# Patient Record
Sex: Male | Born: 1980
Health system: Southern US, Community
[De-identification: ages and names within clinical notes are randomized; demographics above are authoritative.]

## PROBLEM LIST (undated history)

## (undated) DIAGNOSIS — R001 Bradycardia, unspecified: Secondary | ICD-10-CM

## (undated) DIAGNOSIS — E785 Hyperlipidemia, unspecified: Secondary | ICD-10-CM

## (undated) DIAGNOSIS — I255 Ischemic cardiomyopathy: Secondary | ICD-10-CM

## (undated) DIAGNOSIS — I119 Hypertensive heart disease without heart failure: Secondary | ICD-10-CM

## (undated) DIAGNOSIS — I2510 Atherosclerotic heart disease of native coronary artery without angina pectoris: Secondary | ICD-10-CM

## (undated) DIAGNOSIS — M7050 Other bursitis of knee, unspecified knee: Secondary | ICD-10-CM

## (undated) DIAGNOSIS — E669 Obesity, unspecified: Secondary | ICD-10-CM

## (undated) DIAGNOSIS — E1169 Type 2 diabetes mellitus with other specified complication: Secondary | ICD-10-CM

## (undated) DIAGNOSIS — Z720 Tobacco use: Secondary | ICD-10-CM

## (undated) DIAGNOSIS — F17201 Nicotine dependence, unspecified, in remission: Secondary | ICD-10-CM

## (undated) DIAGNOSIS — I5041 Acute combined systolic (congestive) and diastolic (congestive) heart failure: Secondary | ICD-10-CM

## (undated) HISTORY — DX: Nicotine dependence, unspecified, in remission: F17.201

## (undated) HISTORY — PX: OTHER SURGICAL HISTORY: SHX169

---

## 2000-08-21 ENCOUNTER — Emergency Department (HOSPITAL_COMMUNITY): Admission: EM | Admit: 2000-08-21 | Discharge: 2000-08-21 | Payer: Self-pay | Admitting: Emergency Medicine

## 2009-07-07 ENCOUNTER — Emergency Department (HOSPITAL_BASED_OUTPATIENT_CLINIC_OR_DEPARTMENT_OTHER): Admission: EM | Admit: 2009-07-07 | Discharge: 2009-07-08 | Payer: Self-pay | Admitting: Emergency Medicine

## 2010-07-21 LAB — BASIC METABOLIC PANEL
BUN: 10 mg/dL (ref 6–23)
CO2: 29 mEq/L (ref 19–32)
Calcium: 9.3 mg/dL (ref 8.4–10.5)
Chloride: 105 mEq/L (ref 96–112)
Creatinine, Ser: 1 mg/dL (ref 0.4–1.5)
GFR calc Af Amer: >60 mL/min (ref >60)
GFR calc non Af Amer: >60 mL/min (ref >60)
Glucose, Bld: 89 mg/dL (ref 70–99)
Potassium: 4.4 mEq/L (ref 3.5–5.1)
Sodium: 145 mEq/L (ref 135–145)

## 2011-03-15 ENCOUNTER — Emergency Department (INDEPENDENT_AMBULATORY_CARE_PROVIDER_SITE_OTHER): Payer: Self-pay

## 2011-03-15 ENCOUNTER — Encounter: Payer: Self-pay | Admitting: Emergency Medicine

## 2011-03-15 ENCOUNTER — Emergency Department (HOSPITAL_BASED_OUTPATIENT_CLINIC_OR_DEPARTMENT_OTHER)
Admission: EM | Admit: 2011-03-15 | Discharge: 2011-03-15 | Disposition: A | Discharge status: Home / Self Care | Payer: Self-pay | Attending: Emergency Medicine | Admitting: Emergency Medicine

## 2011-03-15 DIAGNOSIS — M25539 Pain in unspecified wrist: Secondary | ICD-10-CM

## 2011-03-15 DIAGNOSIS — F17200 Nicotine dependence, unspecified, uncomplicated: Secondary | ICD-10-CM | POA: Insufficient documentation

## 2011-03-15 MED ORDER — PREDNISONE 50 MG PO TABS
60.0000 mg | ORAL_TABLET | Freq: Once | ORAL | Status: AC
  Administered 2011-03-15: 60 mg via ORAL
  Filled 2011-03-15: qty 1

## 2011-03-15 MED ORDER — PREDNISONE 10 MG PO TABS: 20.0000 mg | ORAL_TABLET | Freq: Every day | ORAL | Status: AC

## 2011-03-15 MED ORDER — OXYCODONE-ACETAMINOPHEN 5-325 MG PO TABS
1.0000 | ORAL_TABLET | Freq: Once | ORAL | Status: AC
  Administered 2011-03-15: 1 via ORAL

## 2011-03-15 MED ORDER — OXYCODONE-ACETAMINOPHEN 5-325 MG PO TABS
ORAL_TABLET | ORAL | Status: AC
  Administered 2011-03-15: 1 via ORAL
  Filled 2011-03-15: qty 1

## 2011-03-15 NOTE — ED Notes (Signed)
Pain and decreased mobility to R wrist

## 2011-03-15 NOTE — ED Provider Notes (Signed)
History     CSN: [id number] Arrival date & time: 03/15/2011  7:55 AM   First MD Initiated Contact with Patient 03/15/11 0805      Chief Complaint  Patient presents with  . Wrist Pain    R wrist pain decreased mobility denies any trauma    (Consider location/radiation/quality/duration/timing/severity/associated sxs/prior treatment) HPI Patient with right wrist pain since last Monday.  Pain noted on awakening that day, no known injury.  Pain worsens with movement.Pain is more medial anterior and sharp, aching, and throbbing.  7/10 at worst and now.  Patient took aleve without relief.  Patient works at [employer].   History reviewed. No pertinent past medical history.  Past Surgical History  Procedure Date  . Arm sugery     History reviewed. No pertinent family history.  History  Substance Use Topics  . Smoking status: Current Some Day Smoker  . Smokeless tobacco: Not on file  . Alcohol Use: No      Review of Systems  All other systems reviewed and are negative.    Allergies  Naprosyn  Home Medications  No current outpatient prescriptions on file.  BP 130/90  Pulse 99  Temp 97.6 F (36.4 C)  Resp 20  SpO2 99%  Physical Exam  Nursing note and vitals reviewed. Constitutional: He is oriented to person, place, and time. He appears well-developed and well-nourished.  HENT:  Head: Normocephalic.  Eyes: Conjunctivae are normal. Pupils are equal, round, and reactive to light.  Neck: Neck supple.  Cardiovascular: Normal rate.   Pulmonary/Chest: Effort normal.  Neurological: He is alert and oriented to person, place, and time. Cranial nerve deficit: right wrist with tenderness with ulnar and radial flexion, mild diffuse tenderness, no swelling or warmth noted.  Skin: Skin is warm and dry.  Psychiatric: He has a normal mood and affect. His behavior is normal. Judgment and thought content normal.    ED Course  Procedures (including critical care time)  Labs  Reviewed - No data to display No results found.   No diagnosis found.    MDM  Dg Wrist Complete Right  03/15/2011  *RADIOLOGY REPORT*  Clinical Data: Chronic right wrist pain  RIGHT WRIST - COMPLETE 3+ VIEW  Comparison: None.  Findings: No fracture or dislocation is seen.  The joint spaces are preserved.  The visualized soft tissues are unremarkable.  IMPRESSION: Normal wrist radiographs.  Original Report Authenticated By: Charline Bills, M.D.         Hilario Quarry, MD 03/15/11 [phone]

## 2012-07-22 ENCOUNTER — Emergency Department (HOSPITAL_BASED_OUTPATIENT_CLINIC_OR_DEPARTMENT_OTHER)
Admission: EM | Admit: 2012-07-22 | Discharge: 2012-07-22 | Disposition: A | Discharge status: Home / Self Care | Payer: Self-pay | Attending: Emergency Medicine | Admitting: Emergency Medicine

## 2012-07-22 ENCOUNTER — Encounter (HOSPITAL_BASED_OUTPATIENT_CLINIC_OR_DEPARTMENT_OTHER): Payer: Self-pay | Admitting: *Deleted

## 2012-07-22 ENCOUNTER — Emergency Department (HOSPITAL_BASED_OUTPATIENT_CLINIC_OR_DEPARTMENT_OTHER): Payer: Self-pay

## 2012-07-22 ENCOUNTER — Ambulatory Visit: Payer: Self-pay | Admitting: Family Medicine

## 2012-07-22 DIAGNOSIS — Z8739 Personal history of other diseases of the musculoskeletal system and connective tissue: Secondary | ICD-10-CM | POA: Insufficient documentation

## 2012-07-22 DIAGNOSIS — M25469 Effusion, unspecified knee: Secondary | ICD-10-CM | POA: Insufficient documentation

## 2012-07-22 DIAGNOSIS — M25461 Effusion, right knee: Secondary | ICD-10-CM

## 2012-07-22 DIAGNOSIS — F17200 Nicotine dependence, unspecified, uncomplicated: Secondary | ICD-10-CM | POA: Insufficient documentation

## 2012-07-22 HISTORY — DX: Other bursitis of knee, unspecified knee: M70.50

## 2012-07-22 MED ORDER — OXYCODONE-ACETAMINOPHEN 5-325 MG PO TABS
2.0000 | ORAL_TABLET | Freq: Once | ORAL | Status: DC
  Filled 2012-07-22 (×2): qty 2

## 2012-07-22 NOTE — ED Provider Notes (Signed)
History     CSN: [id number]  Arrival date & time 07/22/12  6433   First MD Initiated Contact with Patient 07/22/12 [phone]      Chief Complaint  Patient presents with  . Knee Pain    (Consider location/radiation/quality/duration/timing/severity/associated sxs/prior treatment) HPI  Patient with right knee pain and swelling for one week.  No known injury.  Patient states painful and increases with standing, walking, bending knee, Pain began velow knee and then feels like pulling when bends and than "hurts with walking. Soaking, taking aleve, wrapping, ice, elevationg and some improvement.  However, job is pressuring him to have assessed.  States when he was playing football in high school he hurt knee.  De ies redness or warmth.    Past Medical History  Diagnosis Date  . Bursitis of knee     Past Surgical History  Procedure Laterality Date  . Arm sugery      History reviewed. No pertinent family history.  History  Substance Use Topics  . Smoking status: Current Some Day Smoker  . Smokeless tobacco: Not on file  . Alcohol Use: No      Review of Systems  All other systems reviewed and are negative.    Allergies  Naprosyn  Home Medications  No current outpatient prescriptions on file.  There were no vitals taken for this visit.  Physical Exam  Nursing note and vitals reviewed. Constitutional: He is oriented to person, place, and time. He appears well-developed and well-nourished.  HENT:  Head: Normocephalic and atraumatic.  Eyes: Conjunctivae and EOM are normal. Pupils are equal, round, and reactive to light.  Neck: Normal range of motion. Neck supple.  Cardiovascular: Normal rate and regular rhythm.   Pulmonary/Chest: Effort normal and breath sounds normal.  Abdominal: Soft.  Musculoskeletal:  Right knee with tenderness, no redness, mild warmth.  Full arom, no ligamentous laxity.  Some effusion.   Neurological: He is alert and oriented to person, place, and  time.  Skin: Skin is warm and dry.  Psychiatric: He has a normal mood and affect. His behavior is normal. Judgment and thought content normal.    ED Course  Procedures (including critical care time)  Labs Reviewed - No data to display No results found.   No diagnosis found.    MDM  Right knee with pain and effusion.  No signs of infection. Plan Ace wrap, crutches, and referral to Dr. Pearletha Forge.      Hilario Quarry, MD 07/22/12 [phone]

## 2012-07-22 NOTE — ED Notes (Signed)
Pt amb to room 3 with slow, steady gait favoring rle. Pt reports he has had the same in the past, seen here and dx with bursitis, states he has been using ice and heat with no relief.

## 2012-12-20 ENCOUNTER — Other Ambulatory Visit: Payer: Self-pay | Admitting: Occupational Medicine

## 2012-12-20 ENCOUNTER — Ambulatory Visit: Payer: Self-pay

## 2012-12-20 DIAGNOSIS — Z021 Encounter for pre-employment examination: Secondary | ICD-10-CM

## 2013-07-21 ENCOUNTER — Ambulatory Visit: Payer: Self-pay

## 2014-03-23 ENCOUNTER — Encounter (HOSPITAL_BASED_OUTPATIENT_CLINIC_OR_DEPARTMENT_OTHER): Payer: Self-pay | Admitting: Emergency Medicine

## 2014-03-23 ENCOUNTER — Emergency Department (HOSPITAL_BASED_OUTPATIENT_CLINIC_OR_DEPARTMENT_OTHER): Payer: BC Managed Care – PPO

## 2014-03-23 ENCOUNTER — Emergency Department (HOSPITAL_BASED_OUTPATIENT_CLINIC_OR_DEPARTMENT_OTHER)
Admission: EM | Admit: 2014-03-23 | Discharge: 2014-03-24 | Disposition: A | Discharge status: Home / Self Care | Payer: BC Managed Care – PPO | Attending: Emergency Medicine | Admitting: Emergency Medicine

## 2014-03-23 DIAGNOSIS — M25562 Pain in left knee: Secondary | ICD-10-CM | POA: Insufficient documentation

## 2014-03-23 DIAGNOSIS — R2241 Localized swelling, mass and lump, right lower limb: Secondary | ICD-10-CM | POA: Diagnosis not present

## 2014-03-23 DIAGNOSIS — M25569 Pain in unspecified knee: Secondary | ICD-10-CM

## 2014-03-23 DIAGNOSIS — Z720 Tobacco use: Secondary | ICD-10-CM | POA: Insufficient documentation

## 2014-03-23 DIAGNOSIS — R609 Edema, unspecified: Secondary | ICD-10-CM

## 2014-03-23 DIAGNOSIS — M25561 Pain in right knee: Secondary | ICD-10-CM | POA: Insufficient documentation

## 2014-03-23 NOTE — ED Notes (Signed)
Patient states that he has a knot on the back side of his knee, patient notes swelling to his left knee - the patient denies any SOB or Chest pain

## 2014-03-23 NOTE — ED Provider Notes (Addendum)
CSN: [id number]     Arrival date & time 03/23/14  1858 History  This chart was scribed for Kirk Mckee Jawanna Dykman, MD by Julian HyMorgan Graham, ED Scribe. The patient was seen in [facility]. The patient'Mckee care was started at 10:33 PM.     Chief Complaint  Patient presents with  . Knee Pain    Patient is a 33 y.o. male presenting with knee pain. The history is provided by the patient. No language interpreter was used.  Knee Pain Location:  Knee Time since incident:  1 day Injury: no   Knee location:  R knee and L knee Pain details:    Radiates to:  Does not radiate   Severity:  Moderate   Onset quality:  Sudden   Duration:  1 day   Timing:  Constant   Progression:  Unchanged Chronicity:  New Dislocation: no   Foreign body present:  No foreign bodies Relieved by:  None tried Worsened by:  Nothing tried Ineffective treatments:  None tried Associated symptoms: swelling   Associated symptoms: no fever and no numbness     HPI Comments: Kirtland BouchardJerome Wurtz is a 33 y.o. male who presents to the Emergency Department complaining of left knee swelling onset one day ago. Pt notes it feels like a knot. He notes he has some intermittent pain associated with his left knee and swelling on the left knee. He also complains of right knee pain. 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 He denies recent long car or airplane trips. Pt denies SOB, warmth in the knee, redness in the knee and chest pain.   No PCP.  Past Medical History  Diagnosis Date  . Bursitis of knee    Past Surgical History  Procedure Laterality Date  . Arm sugery     History reviewed. No pertinent family history. History  Substance Use Topics  . Smoking status: Current Some Day Smoker  . Smokeless tobacco: Not on file  . Alcohol Use: No    Review of Systems  Constitutional: Negative for fever and chills.  Respiratory: Negative for shortness of breath.   Cardiovascular: Negative for chest pain.  Gastrointestinal: Negative for  nausea and vomiting.  Musculoskeletal: Positive for joint swelling and arthralgias.  Skin: Negative for color change.  All other systems reviewed and are negative.  Allergies  Naprosyn  Home Medications   Prior to Admission medications   Not on File   Triage Vitals: BP 137/82 mmHg  Pulse 98  Temp(Src) 99 F (37.2 C) (Oral)  Resp 18  SpO2 97%  Physical Exam  Constitutional: He is oriented to person, place, and time. He appears well-developed and well-nourished.  HENT:  Head: Normocephalic and atraumatic.  Right Ear: External ear normal.  Left Ear: External ear normal.  Nose: Nose normal.  Mouth/Throat: Oropharynx is clear and moist.  Eyes: Conjunctivae and EOM are normal. Pupils are equal, round, and reactive to light.  Neck: Normal range of motion. Neck supple.  Cardiovascular: Normal rate, regular rhythm, normal heart sounds and intact distal pulses.   Pulmonary/Chest: Effort normal and breath sounds normal.  Abdominal: Soft. Bowel sounds are normal.  Musculoskeletal: Normal range of motion.  Neurological: He is alert and oriented to person, place, and time. He has normal reflexes.  Skin: Skin is warm and dry.  Psychiatric: He has a normal mood and affect. His behavior is normal. Judgment and thought content normal.  Nursing note and vitals reviewed.   ED Course  Procedures (  including critical care time) DIAGNOSTIC STUDIES: Oxygen Saturation is 97% on RA, adequate  by my interpretation.    COORDINATION OF CARE: 10:38 PM- Patient informed of current plan for treatment and evaluation and agrees with plan at this time.  Imaging Review Dg Knee 1-2 Views Left  03/23/2014   CLINICAL DATA:  Left knee pain and swelling without injury.  EXAM: LEFT KNEE - 1-2 VIEW  COMPARISON:  None.  FINDINGS: There is no evidence of fracture, dislocation, or joint effusion. There is no evidence of arthropathy or other focal bone abnormality. Soft tissues are unremarkable.  IMPRESSION:  Normal left knee.   Electronically Signed   By: Roque LiasJames  Green M.D.   On: 03/23/2014 20:10  Dg Knee 1-2 Views Left  03/23/2014   CLINICAL DATA:  Left knee pain and swelling without injury.  EXAM: LEFT KNEE - 1-2 VIEW  COMPARISON:  None.  FINDINGS: There is no evidence of fracture, dislocation, or joint effusion. There is no evidence of arthropathy or other focal bone abnormality. Soft tissues are unremarkable.  IMPRESSION: Normal left knee.   Electronically Signed   By: Roque LiasJames  Green M.D.   On: 03/23/2014 20:10   [place] Extrem Low Left Ltd  03/24/2014   CLINICAL DATA:  LEFT medial knee lump for 1 week, pain with ambulation.  EXAM: ULTRASOUND LEFT LOWER EXTREMITY LIMITED  TECHNIQUE: Multiplanar high-resolution grayscale and color sonogram of the LEFT knee tailored 2 palpable abnormality.  COMPARISON:  LEFT knee radiographs March 23, 2014  FINDINGS: 3.1 x 4.8 x 3.7 cm mass with some acoustic enhancement within the LEFT medial knee corresponding to palpable abnormality. Lace-like central appearance without vascularity.  IMPRESSION: 3.1 x 4.8 x 3.7 cm complex mass and LEFT medial knee soft tissues corresponding to palpable abnormality which appears to reflect a hematoma/thrombosed varicosity though, considering absence of pain, tumor is a consideration. Recommend follow-up MRI of the knee with contrast in 7-10 days, if abnormality persists.   Electronically Signed   By: Awilda Metroourtnay  Bloomer   On: 03/24/2014 00:19      MDM   Final diagnoses:  Knee pain  Leg mass, right   I personally performed the services described in this documentation, which was scribed in my presence. The recorded information has been reviewed and considered.   Kirk Mckee Kirk Whitacre, MD 03/24/14 [id number]  Kirk Mckee Kirk Colter, MD 03/24/14 [phone]

## 2014-03-24 NOTE — Discharge Instructions (Signed)
Please recheck with Dr. Pearletha ForgeHudnall next week.  You need an MRI for further evaluation of the swelling of your knee.   Arthritis, Nonspecific Arthritis is inflammation of a joint. This usually means pain, redness, warmth or swelling are present. One or more joints may be involved. There are a number of types of arthritis. Your caregiver may not be able to tell what type of arthritis you have right away. CAUSES  The most common cause of arthritis is the wear and tear on the joint (osteoarthritis). This causes damage to the cartilage, which can break down over time. The knees, hips, back and neck are most often affected by this type of arthritis. Other types of arthritis and common causes of joint pain include:  Sprains and other injuries near the joint. Sometimes minor sprains and injuries cause pain and swelling that develop hours later.  Rheumatoid arthritis. This affects hands, feet and knees. It usually affects both sides of your body at the same time. It is often associated with chronic ailments, fever, weight loss and general weakness.  Crystal arthritis. Gout and pseudo gout can cause occasional acute severe pain, redness and swelling in the foot, ankle, or knee.  Infectious arthritis. Bacteria can get into a joint through a break in overlying skin. This can cause infection of the joint. Bacteria and viruses can also spread through the blood and affect your joints.  Drug, infectious and allergy reactions. Sometimes joints can become mildly painful and slightly swollen with these types of illnesses. SYMPTOMS   Pain is the main symptom.  Your joint or joints can also be red, swollen and warm or hot to the touch.  You may have a fever with certain types of arthritis, or even feel overall ill.  The joint with arthritis will hurt with movement. Stiffness is present with some types of arthritis. DIAGNOSIS  Your caregiver will suspect arthritis based on your description of your symptoms  and on your exam. Testing may be needed to find the type of arthritis:  Blood and sometimes urine tests.  X-Antero Derosia tests and sometimes CT or MRI scans.  Removal of fluid from the joint (arthrocentesis) is done to check for bacteria, crystals or other causes. Your caregiver (or a specialist) will numb the area over the joint with a local anesthetic, and use a needle to remove joint fluid for examination. This procedure is only minimally uncomfortable.  Even with these tests, your caregiver may not be able to tell what kind of arthritis you have. Consultation with a specialist (rheumatologist) may be helpful. TREATMENT  Your caregiver will discuss with you treatment specific to your type of arthritis. If the specific type cannot be determined, then the following general recommendations may apply. Treatment of severe joint pain includes:  Rest.  Elevation.  Anti-inflammatory medication (for example, ibuprofen) may be prescribed. Avoiding activities that cause increased pain.  Only take over-the-counter or prescription medicines for pain and discomfort as recommended by your caregiver.  Cold packs over an inflamed joint may be used for 10 to 15 minutes every hour. Hot packs sometimes feel better, but do not use overnight. Do not use hot packs if you are diabetic without your caregiver's permission.  A cortisone shot into arthritic joints may help reduce pain and swelling.  Any acute arthritis that gets worse over the next 1 to 2 days needs to be looked at to be sure there is no joint infection. Long-term arthritis treatment involves modifying activities and lifestyle to reduce joint  stress jarring. This can include weight loss. Also, exercise is needed to nourish the joint cartilage and remove waste. This helps keep the muscles around the joint strong. HOME CARE INSTRUCTIONS   Do not take aspirin to relieve pain if gout is suspected. This elevates uric acid levels.  Only take over-the-counter  or prescription medicines for pain, discomfort or fever as directed by your caregiver.  Rest the joint as much as possible.  If your joint is swollen, keep it elevated.  Use crutches if the painful joint is in your leg.  Drinking plenty of fluids may help for certain types of arthritis.  Follow your caregiver's dietary instructions.  Try low-impact exercise such as:  Swimming.  Water aerobics.  Biking.  Walking.  Morning stiffness is often relieved by a warm shower.  Put your joints through regular range-of-motion. SEEK MEDICAL CARE IF:   You do not feel better in 24 hours or are getting worse.  You have side effects to medications, or are not getting better with treatment. SEEK IMMEDIATE MEDICAL CARE IF:   You have a fever.  You develop severe joint pain, swelling or redness.  Many joints are involved and become painful and swollen.  There is severe back pain and/or leg weakness.  You have loss of bowel or bladder control. Document Released: 05/21/2004 Document Revised: 07/06/2011 Document Reviewed: 06/06/2008 [facility] Patient Information 2015 [place], [place]. This information is not intended to replace advice given to you by your health care provider. Make sure you discuss any questions you have with your health care provider.

## 2014-04-12 ENCOUNTER — Ambulatory Visit (INDEPENDENT_AMBULATORY_CARE_PROVIDER_SITE_OTHER): Payer: BC Managed Care – PPO | Admitting: Family Medicine

## 2014-04-12 ENCOUNTER — Encounter: Payer: Self-pay | Admitting: Family Medicine

## 2014-04-12 VITALS — BP 138/97 | HR 90 | Ht 72.0 in | Wt 320.0 lb

## 2014-04-12 DIAGNOSIS — M25562 Pain in left knee: Secondary | ICD-10-CM

## 2014-04-12 NOTE — Patient Instructions (Signed)
We will go ahead with an MRI of your knee to evaluate and to assess the mass behind your knee. Follow up with me the business day following the MRI to go over this. We may do an aspiration and injection of your knee then (and send the fluid to the lab) if the inside of your knee looks fine like I suspect. You're describing the way gout presents. Icing 15 minutes at a time 3-4 times a day. Ibuprofen 600mg  three times a day with food OR aleve 2 tabs twice a day with food for pain and inflammation. Elevation about your heart as needed for swelling. ACE wrap for compression and support.

## 2014-04-13 NOTE — Progress Notes (Addendum)
PCP: No primary care provider on file.  Subjective:   HPI: Patient is a 33 y.o. male here for left knee pain, swelling.  Patient reports for the past 2 weeks without injury he has had swelling medial left knee that is rather firm. Associated with anterior knee pain as well. Has tried aleve, ibuprofen, tylenol. No catching, locking, giving out. Had problems with pain without injury in his feet before. Never been diagnosed with gout. No other joint involvement.  Past Medical History  Diagnosis Date  . Bursitis of knee     No current outpatient prescriptions on file prior to visit.   No current facility-administered medications on file prior to visit.    Past Surgical History  Procedure Laterality Date  . Arm sugery      Allergies  Allergen Reactions  . Naprosyn [Naproxen]     History   Social History  . Marital Status: Single    Spouse Name: N/A    Number of Children: N/A  . Years of Education: N/A   Occupational History  . Not on file.   Social History Main Topics  . Smoking status: Current Some Day Smoker -- 0.50 packs/day    Types: Cigarettes  . Smokeless tobacco: Not on file  . Alcohol Use: No  . Drug Use: No  . Sexual Activity: Yes   Other Topics Concern  . Not on file   Social History Narrative    No family history on file.  BP 138/97 mmHg  Pulse 90  Ht 6' (1.829 m)  Wt 320 lb (145.151 kg)  BMI 43.39 kg/m2  Review of Systems: See HPI above.    Objective:  Physical Exam:  Gen: NAD  Left knee: Mild effusion.  No bruising.  Firm mass palpated posteromedial left knee, minimal mobility. Minimal tenderness medial and lateral joint lines. FROM. Negative ant/post drawers. Negative valgus/varus testing. Negative lachmanns. Negative mcmurrays, apleys, patellar apprehension. NV intact distally.    Assessment & Plan:  1. Left knee pain, swelling - Patient has a relatively large mass behind this knee seen by ultrasound.  Believe we need to  better characterize this as it appears complex - will move forward with MRI with and without contrast to assess.  In addition to this it's possible he has gout.  Would like him to see me in the office the day following MRI to go over the test and to discuss possible aspiration/injection for diagnostic purposes.  Icing, nsaids, ACE wrap, elevation in meantime.  Addendum:  MRI reviewed and discussed with patient.  The mass is nonenhancing, not a carcinoma.  Consistent with atypical cyst with hemorrhagic component.  Reassured patient about this.  Has meniscus tear as well.  Discussed a couple options: trial of aspiration/injeciton of knee (or of cyst); referral to orthopedics to discuss removal of cyst, meniscal debridement.  He is going to discuss with his father who has had knee surgery in the past and call us back.

## 2014-04-16 DIAGNOSIS — M25562 Pain in left knee: Secondary | ICD-10-CM | POA: Insufficient documentation

## 2014-04-16 NOTE — Assessment & Plan Note (Signed)
Patient has a relatively large mass behind this knee seen by ultrasound.  Believe we need to better characterize this as it appears complex - will move forward with MRI with and without contrast to assess.  In addition to this it's possible he has gout.  Would like him to see me in the office the day following MRI to go over the test and to discuss possible aspiration/injection for diagnostic purposes.  Icing, nsaids, ACE wrap, elevation in meantime.

## 2014-04-18 ENCOUNTER — Ambulatory Visit (HOSPITAL_BASED_OUTPATIENT_CLINIC_OR_DEPARTMENT_OTHER)
Admission: RE | Admit: 2014-04-18 | Discharge: 2014-04-18 | Disposition: A | Discharge status: Home / Self Care | Payer: BC Managed Care – PPO | Source: Ambulatory Visit | Attending: Family Medicine | Admitting: Family Medicine

## 2014-04-18 DIAGNOSIS — R2242 Localized swelling, mass and lump, left lower limb: Secondary | ICD-10-CM | POA: Insufficient documentation

## 2014-04-18 DIAGNOSIS — M25562 Pain in left knee: Secondary | ICD-10-CM | POA: Diagnosis present

## 2014-04-26 NOTE — Addendum Note (Signed)
Addended by: Lenda KelpHUDNALL, Letti Towell R on: 04/26/2014 10:18 AM   Modules accepted: Kipp BroodSmartSet

## 2014-05-06 IMAGING — CR DG KNEE 1-2V*R*
2 series · 2 of 2 positions shown · non-contrast
Comparison: None.

CLINICAL DATA: Knee pain.

RIGHT KNEE - 1-2 VIEW

[view not recorded (1 of 2)]
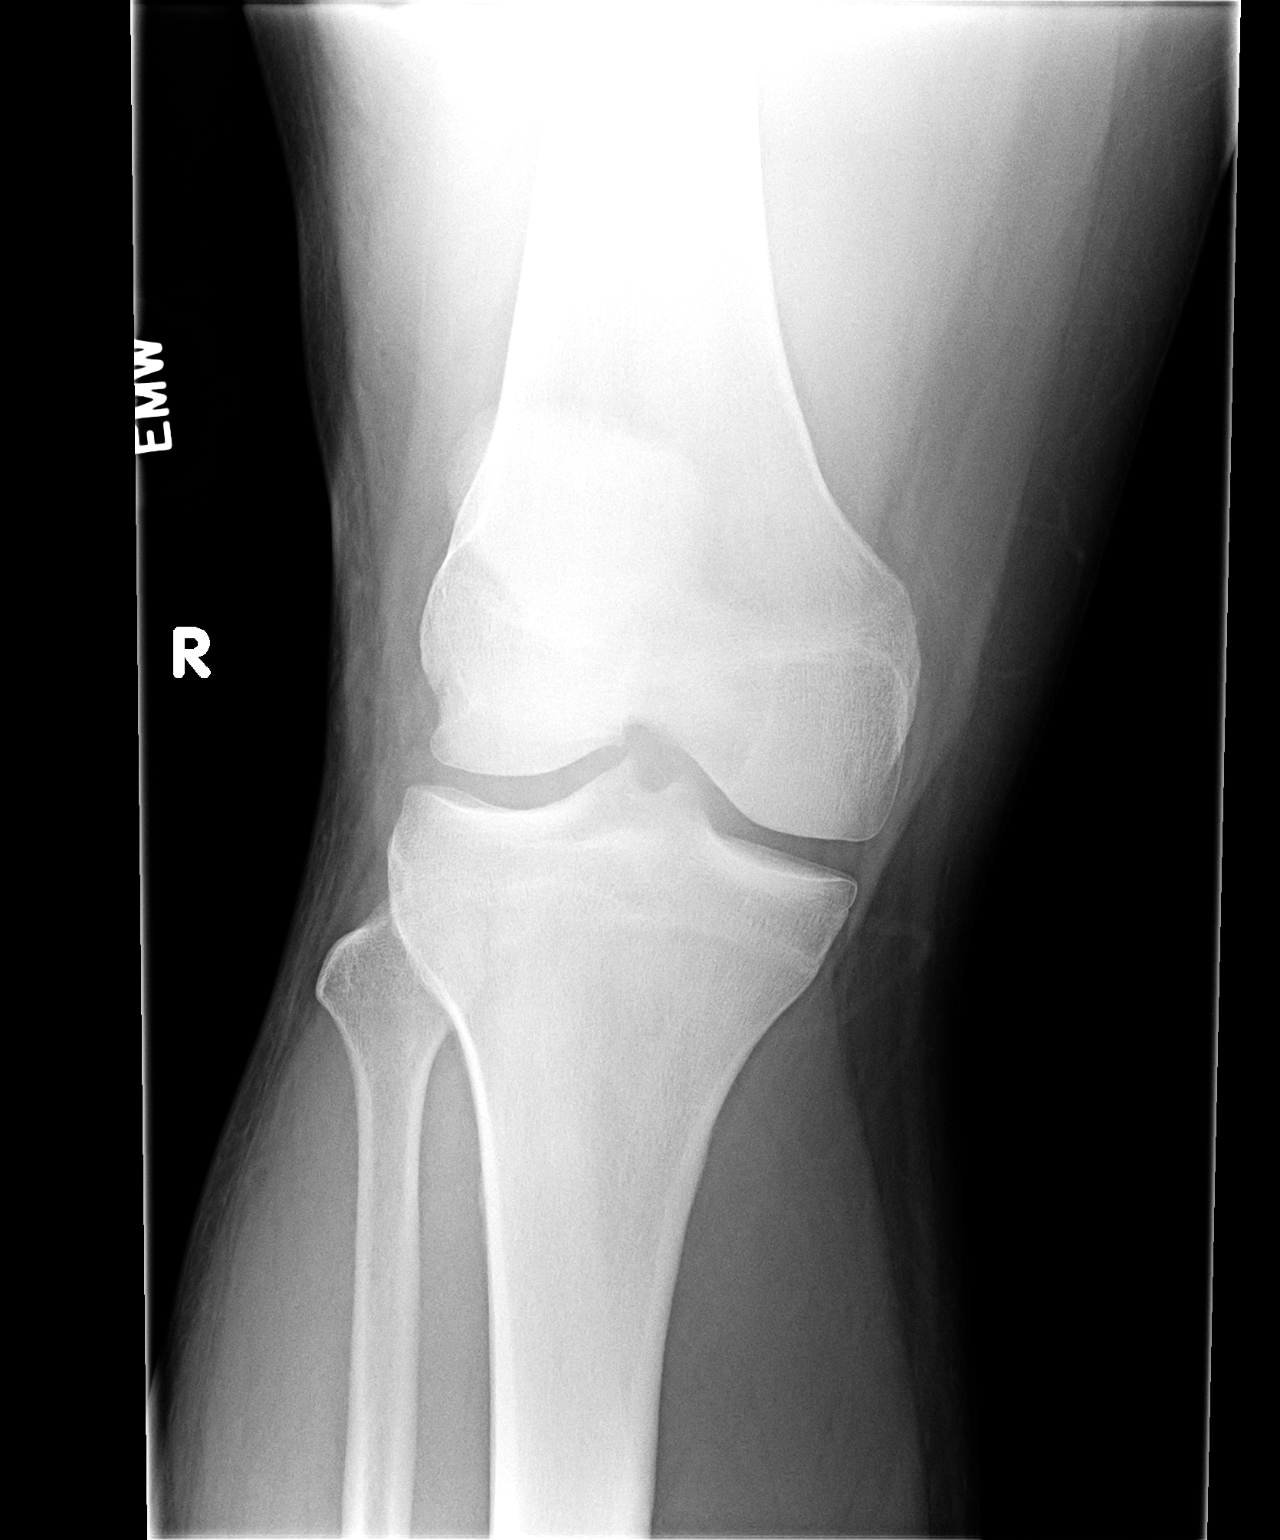

[view not recorded (2 of 2)]
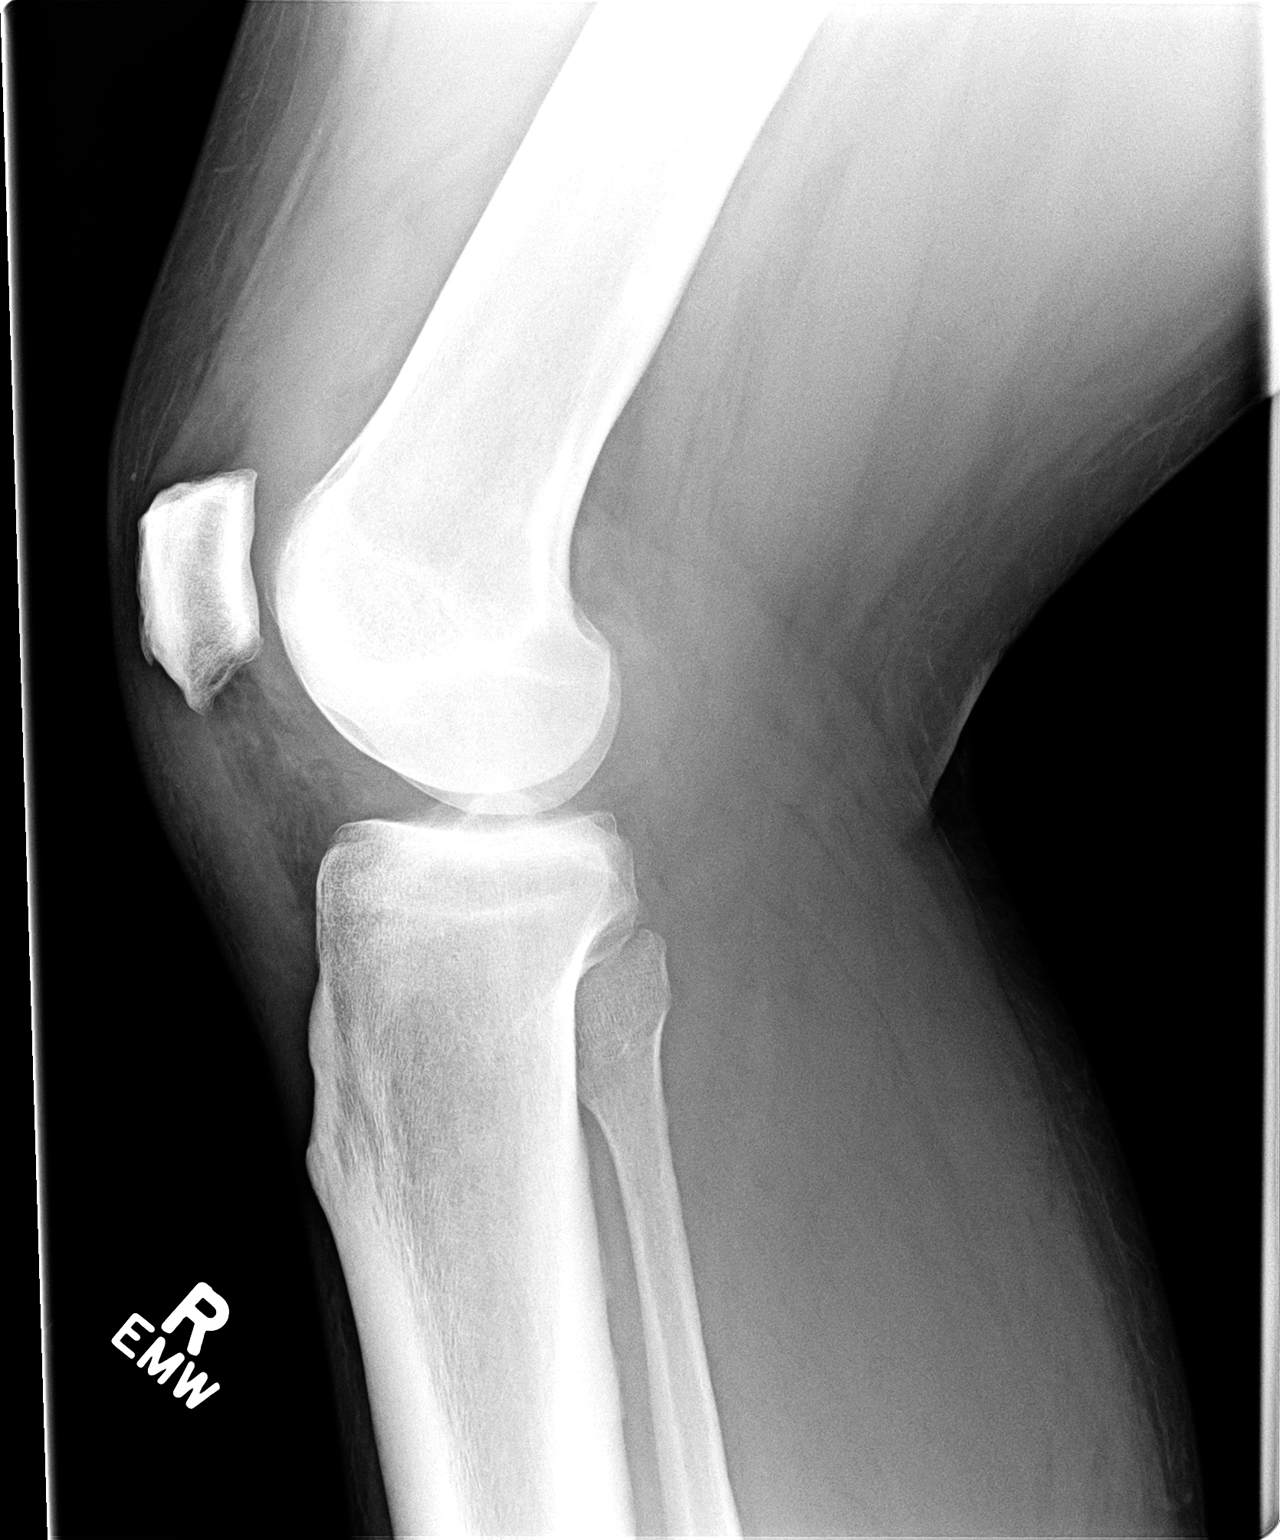

[2 of 2 positions shown; findings below may reference images not displayed]

FINDINGS: There is a small to moderate joint effusion.  No
fracture.  No significant degenerative changes.
IMPRESSION: Small to moderate joint effusion.

## 2014-05-15 ENCOUNTER — Ambulatory Visit (INDEPENDENT_AMBULATORY_CARE_PROVIDER_SITE_OTHER): Payer: BLUE CROSS/BLUE SHIELD | Admitting: Family Medicine

## 2014-05-15 ENCOUNTER — Encounter: Payer: Self-pay | Admitting: Family Medicine

## 2014-05-15 VITALS — BP 159/100 | HR 91 | Ht 72.0 in | Wt 320.0 lb

## 2014-05-15 DIAGNOSIS — M25562 Pain in left knee: Secondary | ICD-10-CM

## 2014-05-15 MED ORDER — METHYLPREDNISOLONE ACETATE 40 MG/ML IJ SUSP
40.0000 mg | Freq: Once | INTRAMUSCULAR | Status: AC
  Administered 2014-05-15: 40 mg via INTRA_ARTICULAR

## 2014-05-17 NOTE — Assessment & Plan Note (Signed)
MRI showed mass behind knee is an atypical cyst with hemorrhagic component, some communication with a bakers cyst.  He also has a meniscus tear of this knee.  Pain is better currently.  Does have an effusion.  We discussed options and went ahead with intraarticular cortisone injection today.  Icing, nsaids, ACE wrap, elevation as well.  Call us to let us know how he's doing in 1-2 weeks.  After informed written consent, patient was lying supine on exam table. Left knee was prepped with alcohol swab and utilizing superolateral approach with ultrasound guidance, patient's left knee was injected intraarticularly with 3:1 marcaine: depomedrol. Patient tolerated the procedure well without immediate complications.

## 2014-05-17 NOTE — Progress Notes (Signed)
PCP: No primary care provider on file.  Subjective:   HPI: Patient is a 34 y.o. male here for left knee pain, swelling.  12/17: Patient reports for the past 2 weeks without injury he has had swelling medial left knee that is rather firm. Associated with anterior knee pain as well. Has tried aleve, ibuprofen, tylenol. No catching, locking, giving out. Had problems with pain without injury in his feet before. Never been diagnosed with gout. No other joint involvement.  05/15/14: Patient reports his pain has improved since last visit. Still with the swelling behind the knee. Pain about 1/10 currently. He would like to go ahead with injection into the knee as we discussed on the phone.  Past Medical History  Diagnosis Date  . Bursitis of knee     No current outpatient prescriptions on file prior to visit.   No current facility-administered medications on file prior to visit.    Past Surgical History  Procedure Laterality Date  . Arm sugery      Allergies  Allergen Reactions  . Naprosyn [Naproxen]     History   Social History  . Marital Status: Single    Spouse Name: N/A    Number of Children: N/A  . Years of Education: N/A   Occupational History  . Not on file.   Social History Main Topics  . Smoking status: Current Some Day Smoker -- 0.50 packs/day    Types: Cigarettes  . Smokeless tobacco: Not on file  . Alcohol Use: No  . Drug Use: No  . Sexual Activity: Yes   Other Topics Concern  . Not on file   Social History Narrative    No family history on file.  BP 159/100 mmHg  Pulse 91  Ht 6' (1.829 m)  Wt 320 lb (145.151 kg)  BMI 43.39 kg/m2  Review of Systems: See HPI above.    Objective:  Physical Exam:  Gen: NAD  Left knee: Mod effusion.  No bruising.  Firm mass palpated posteromedial left knee, minimal mobility though decreased in size compared to last visit. Minimal tenderness medial and lateral joint lines. FROM. Negative ant/post  drawers. Negative valgus/varus testing. Negative lachmanns. Negative mcmurrays, apleys, patellar apprehension. NV intact distally.    Assessment & Plan:  1. Left knee pain, swelling - MRI showed mass behind knee is an atypical cyst with hemorrhagic component, some communication with a bakers cyst.  He also has a meniscus tear of this knee.  Pain is better currently.  Does have an effusion.  We discussed options and went ahead with intraarticular cortisone injection today.  Icing, nsaids, ACE wrap, elevation as well.  Call us to let us know how he's doing in 1-2 weeks.  After informed written consent, patient was lying supine on exam table. Left knee was prepped with alcohol swab and utilizing superolateral approach with ultrasound guidance, patient's left knee was injected intraarticularly with 3:1 marcaine: depomedrol. Patient tolerated the procedure well without immediate complications.

## 2015-11-02 ENCOUNTER — Inpatient Hospital Stay (HOSPITAL_COMMUNITY)
Admission: EM | Admit: 2015-11-02 | Discharge: 2015-11-03 | DRG: 246 | Disposition: A | Discharge status: Home / Self Care | Payer: Managed Care, Other (non HMO) | Attending: Cardiovascular Disease | Admitting: Cardiovascular Disease

## 2015-11-02 ENCOUNTER — Inpatient Hospital Stay (HOSPITAL_COMMUNITY): Payer: Managed Care, Other (non HMO)

## 2015-11-02 ENCOUNTER — Encounter (HOSPITAL_COMMUNITY): Payer: Self-pay | Admitting: Cardiovascular Disease

## 2015-11-02 ENCOUNTER — Ambulatory Visit (HOSPITAL_COMMUNITY): Admit: 2015-11-02 | Payer: Self-pay | Admitting: Cardiovascular Disease

## 2015-11-02 ENCOUNTER — Other Ambulatory Visit (HOSPITAL_COMMUNITY): Payer: Self-pay

## 2015-11-02 ENCOUNTER — Encounter (HOSPITAL_COMMUNITY)
Admission: EM | Disposition: A | Discharge status: Home / Self Care | Payer: Self-pay | Source: Home / Self Care | Attending: Cardiovascular Disease

## 2015-11-02 DIAGNOSIS — I2510 Atherosclerotic heart disease of native coronary artery without angina pectoris: Secondary | ICD-10-CM | POA: Diagnosis present

## 2015-11-02 DIAGNOSIS — R001 Bradycardia, unspecified: Secondary | ICD-10-CM | POA: Diagnosis present

## 2015-11-02 DIAGNOSIS — E876 Hypokalemia: Secondary | ICD-10-CM | POA: Diagnosis present

## 2015-11-02 DIAGNOSIS — I2119 ST elevation (STEMI) myocardial infarction involving other coronary artery of inferior wall: Secondary | ICD-10-CM | POA: Diagnosis present

## 2015-11-02 DIAGNOSIS — Z6841 Body Mass Index (BMI) 40.0 and over, adult: Secondary | ICD-10-CM | POA: Diagnosis not present

## 2015-11-02 DIAGNOSIS — E119 Type 2 diabetes mellitus without complications: Secondary | ICD-10-CM

## 2015-11-02 DIAGNOSIS — I252 Old myocardial infarction: Secondary | ICD-10-CM | POA: Diagnosis present

## 2015-11-02 DIAGNOSIS — Z720 Tobacco use: Secondary | ICD-10-CM | POA: Diagnosis present

## 2015-11-02 DIAGNOSIS — F17201 Nicotine dependence, unspecified, in remission: Secondary | ICD-10-CM | POA: Diagnosis present

## 2015-11-02 DIAGNOSIS — E1165 Type 2 diabetes mellitus with hyperglycemia: Secondary | ICD-10-CM | POA: Diagnosis present

## 2015-11-02 DIAGNOSIS — F17210 Nicotine dependence, cigarettes, uncomplicated: Secondary | ICD-10-CM | POA: Diagnosis present

## 2015-11-02 DIAGNOSIS — E669 Obesity, unspecified: Secondary | ICD-10-CM | POA: Diagnosis present

## 2015-11-02 DIAGNOSIS — R079 Chest pain, unspecified: Secondary | ICD-10-CM | POA: Diagnosis present

## 2015-11-02 DIAGNOSIS — E1169 Type 2 diabetes mellitus with other specified complication: Secondary | ICD-10-CM | POA: Insufficient documentation

## 2015-11-02 DIAGNOSIS — E785 Hyperlipidemia, unspecified: Secondary | ICD-10-CM | POA: Diagnosis present

## 2015-11-02 DIAGNOSIS — I213 ST elevation (STEMI) myocardial infarction of unspecified site: Secondary | ICD-10-CM

## 2015-11-02 DIAGNOSIS — I110 Hypertensive heart disease with heart failure: Secondary | ICD-10-CM | POA: Diagnosis present

## 2015-11-02 DIAGNOSIS — I5042 Chronic combined systolic (congestive) and diastolic (congestive) heart failure: Secondary | ICD-10-CM

## 2015-11-02 DIAGNOSIS — D72829 Elevated white blood cell count, unspecified: Secondary | ICD-10-CM | POA: Diagnosis present

## 2015-11-02 DIAGNOSIS — I2111 ST elevation (STEMI) myocardial infarction involving right coronary artery: Secondary | ICD-10-CM

## 2015-11-02 DIAGNOSIS — Z9861 Coronary angioplasty status: Secondary | ICD-10-CM

## 2015-11-02 DIAGNOSIS — I5041 Acute combined systolic (congestive) and diastolic (congestive) heart failure: Secondary | ICD-10-CM | POA: Diagnosis present

## 2015-11-02 DIAGNOSIS — I255 Ischemic cardiomyopathy: Secondary | ICD-10-CM | POA: Diagnosis present

## 2015-11-02 DIAGNOSIS — I10 Essential (primary) hypertension: Secondary | ICD-10-CM

## 2015-11-02 HISTORY — DX: Tobacco use: Z72.0

## 2015-11-02 HISTORY — DX: Ischemic cardiomyopathy: I25.5

## 2015-11-02 HISTORY — DX: Type 2 diabetes mellitus with other specified complication: E66.9

## 2015-11-02 HISTORY — DX: Type 2 diabetes mellitus with other specified complication: E11.69

## 2015-11-02 HISTORY — DX: Hypertensive heart disease without heart failure: I11.9

## 2015-11-02 HISTORY — DX: Hyperlipidemia, unspecified: E78.5

## 2015-11-02 HISTORY — DX: Acute combined systolic (congestive) and diastolic (congestive) heart failure: I50.41

## 2015-11-02 HISTORY — DX: Bradycardia, unspecified: R00.1

## 2015-11-02 HISTORY — PX: CARDIAC CATHETERIZATION: SHX172

## 2015-11-02 HISTORY — DX: Atherosclerotic heart disease of native coronary artery without angina pectoris: I25.10

## 2015-11-02 LAB — CBC
HCT: 48.1 % (ref 39.0–52.0)
HCT: 46.7 % (ref 39.0–52.0)
Hemoglobin: 15.6 g/dL (ref 13.0–17.0)
Hemoglobin: 15.2 g/dL (ref 13.0–17.0)
MCH: 27.9 pg (ref 26.0–34.0)
MCH: 28 pg (ref 26.0–34.0)
MCHC: 32.4 g/dL (ref 30.0–36.0)
MCHC: 32.5 g/dL (ref 30.0–36.0)
MCV: 85.9 fL (ref 78.0–100.0)
MCV: 86 fL (ref 78.0–100.0)
Platelets: 244 10*3/uL (ref 150–400)
Platelets: 269 10*3/uL (ref 150–400)
RBC: 5.6 MIL/uL (ref 4.22–5.81)
RBC: 5.43 MIL/uL (ref 4.22–5.81)
RDW: 12.9 % (ref 11.5–15.5)
RDW: 13.1 % (ref 11.5–15.5)
WBC: 11.8 10*3/uL — ABNORMAL HIGH (ref 4.0–10.5)
WBC: 14.1 10*3/uL — ABNORMAL HIGH (ref 4.0–10.5)

## 2015-11-02 LAB — I-STAT CHEM 8, ED
BUN: 8 mg/dL (ref 6–20)
Calcium, Ion: 1.13 mmol/L (ref 1.13–1.30)
Chloride: 101 mmol/L (ref 101–111)
Creatinine, Ser: 1 mg/dL (ref 0.61–1.24)
Glucose, Bld: 415 mg/dL — ABNORMAL HIGH (ref 65–99)
HCT: 51 % (ref 39.0–52.0)
Hemoglobin: 17.3 g/dL — ABNORMAL HIGH (ref 13.0–17.0)
Potassium: 4.2 mmol/L (ref 3.5–5.1)
Sodium: 138 mmol/L (ref 135–145)
TCO2: 22 mmol/L (ref 0–100)

## 2015-11-02 LAB — POCT ACTIVATED CLOTTING TIME
Activated Clotting Time: 213 seconds
Activated Clotting Time: 296 seconds

## 2015-11-02 LAB — MRSA PCR SCREENING: MRSA by PCR: NEGATIVE

## 2015-11-02 LAB — BASIC METABOLIC PANEL
Anion gap: 12 (ref 5–15)
BUN: 8 mg/dL (ref 6–20)
CO2: 20 mmol/L — ABNORMAL LOW (ref 22–32)
Calcium: 9.1 mg/dL (ref 8.9–10.3)
Chloride: 102 mmol/L (ref 101–111)
Creatinine, Ser: 0.99 mg/dL (ref 0.61–1.24)
GFR calc Af Amer: >60 mL/min (ref >60)
GFR calc non Af Amer: >60 mL/min (ref >60)
Glucose, Bld: 421 mg/dL — ABNORMAL HIGH (ref 65–99)
Potassium: 3.9 mmol/L (ref 3.5–5.1)
Sodium: 134 mmol/L — ABNORMAL LOW (ref 135–145)

## 2015-11-02 LAB — LIPID PANEL
Cholesterol: 319 mg/dL — ABNORMAL HIGH (ref 0–200)
Cholesterol: 298 mg/dL — ABNORMAL HIGH (ref 0–200)
HDL: 35 mg/dL — ABNORMAL LOW (ref >40)
HDL: 36 mg/dL — ABNORMAL LOW (ref >40)
LDL Cholesterol: UNDETERMINED mg/dL (ref 0–99)
LDL Cholesterol: 226 mg/dL — ABNORMAL HIGH (ref 0–99)
Total CHOL/HDL Ratio: 9.1 RATIO
Total CHOL/HDL Ratio: 8.3 RATIO
Triglycerides: 434 mg/dL — ABNORMAL HIGH (ref <150)
Triglycerides: 181 mg/dL — ABNORMAL HIGH (ref <150)
VLDL: UNDETERMINED mg/dL (ref 0–40)
VLDL: 36 mg/dL (ref 0–40)

## 2015-11-02 LAB — GLUCOSE, CAPILLARY
Glucose-Capillary: 451 mg/dL — ABNORMAL HIGH (ref 65–99)
Glucose-Capillary: 314 mg/dL — ABNORMAL HIGH (ref 65–99)
Glucose-Capillary: 295 mg/dL — ABNORMAL HIGH (ref 65–99)
Glucose-Capillary: 240 mg/dL — ABNORMAL HIGH (ref 65–99)
Glucose-Capillary: 242 mg/dL — ABNORMAL HIGH (ref 65–99)

## 2015-11-02 LAB — COMPREHENSIVE METABOLIC PANEL
ALT: 34 U/L (ref 17–63)
AST: 24 U/L (ref 15–41)
Albumin: 3.8 g/dL (ref 3.5–5.0)
Alkaline Phosphatase: 68 U/L (ref 38–126)
Anion gap: 11 (ref 5–15)
BUN: 8 mg/dL (ref 6–20)
CO2: 20 mmol/L — ABNORMAL LOW (ref 22–32)
Calcium: 9.3 mg/dL (ref 8.9–10.3)
Chloride: 102 mmol/L (ref 101–111)
Creatinine, Ser: 1.16 mg/dL (ref 0.61–1.24)
GFR calc Af Amer: >60 mL/min (ref >60)
GFR calc non Af Amer: >60 mL/min (ref >60)
Glucose, Bld: 435 mg/dL — ABNORMAL HIGH (ref 65–99)
Potassium: 4.3 mmol/L (ref 3.5–5.1)
Sodium: 133 mmol/L — ABNORMAL LOW (ref 135–145)
Total Bilirubin: 0.7 mg/dL (ref 0.3–1.2)
Total Protein: 7.4 g/dL (ref 6.5–8.1)

## 2015-11-02 LAB — DIFFERENTIAL
Basophils Absolute: 0 10*3/uL (ref 0.0–0.1)
Basophils Relative: 0 %
Eosinophils Absolute: 0.2 10*3/uL (ref 0.0–0.7)
Eosinophils Relative: 2 %
Lymphocytes Relative: 30 %
Lymphs Abs: 3.5 10*3/uL (ref 0.7–4.0)
Monocytes Absolute: 0.9 10*3/uL (ref 0.1–1.0)
Monocytes Relative: 8 %
Neutro Abs: 7.2 10*3/uL (ref 1.7–7.7)
Neutrophils Relative %: 60 %

## 2015-11-02 LAB — PROTIME-INR
INR: 0.94 (ref 0.00–1.49)
Prothrombin Time: 12.7 seconds (ref 11.6–15.2)

## 2015-11-02 LAB — LACTIC ACID, PLASMA
Lactic Acid, Venous: 2.3 mmol/L (ref 0.5–1.9)
Lactic Acid, Venous: 2.3 mmol/L (ref 0.5–1.9)

## 2015-11-02 LAB — ECHOCARDIOGRAM COMPLETE
Height: 72 in
Weight: 4751.35 oz

## 2015-11-02 LAB — I-STAT CG4 LACTIC ACID, ED: Lactic Acid, Venous: 4.74 mmol/L (ref 0.5–1.9)

## 2015-11-02 LAB — CBG MONITORING, ED: Glucose-Capillary: 441 mg/dL — ABNORMAL HIGH (ref 65–99)

## 2015-11-02 LAB — TROPONIN I
Troponin I: <0.03 ng/mL (ref <0.03)
Troponin I: >65 ng/mL (ref <0.03)

## 2015-11-02 LAB — I-STAT TROPONIN, ED: Troponin i, poc: 0 ng/mL (ref 0.00–0.08)

## 2015-11-02 LAB — APTT: aPTT: <20 seconds — ABNORMAL LOW (ref 24–37)

## 2015-11-02 SURGERY — LEFT HEART CATH AND CORONARY ANGIOGRAPHY
Anesthesia: LOCAL

## 2015-11-02 MED ORDER — ATORVASTATIN CALCIUM 80 MG PO TABS
80.0000 mg | ORAL_TABLET | Freq: Every day | ORAL | Status: DC
  Administered 2015-11-02: 80 mg via ORAL
  Filled 2015-11-02: qty 1

## 2015-11-02 MED ORDER — FUROSEMIDE 10 MG/ML IJ SOLN
20.0000 mg | Freq: Once | INTRAMUSCULAR | Status: AC
  Administered 2015-11-02: 20 mg via INTRAVENOUS
  Filled 2015-11-02: qty 2

## 2015-11-02 MED ORDER — SODIUM CHLORIDE 0.9% FLUSH: 3.0000 mL | INTRAVENOUS | Status: DC | PRN

## 2015-11-02 MED ORDER — NITROGLYCERIN 0.4 MG SL SUBL
SUBLINGUAL_TABLET | SUBLINGUAL | Status: AC
  Filled 2015-11-02: qty 1

## 2015-11-02 MED ORDER — VERAPAMIL HCL 2.5 MG/ML IV SOLN
INTRAVENOUS | Status: DC | PRN
  Administered 2015-11-02: 10 mL via INTRA_ARTERIAL

## 2015-11-02 MED ORDER — PRASUGREL HCL 10 MG PO TABS
10.0000 mg | ORAL_TABLET | Freq: Every day | ORAL | Status: DC
  Administered 2015-11-02 – 2015-11-03 (×2): 10 mg via ORAL
  Filled 2015-11-02 (×2): qty 1

## 2015-11-02 MED ORDER — VERAPAMIL HCL 2.5 MG/ML IV SOLN
INTRAVENOUS | Status: AC
  Filled 2015-11-02: qty 2

## 2015-11-02 MED ORDER — INSULIN ASPART 100 UNIT/ML ~~LOC~~ SOLN
15.0000 [IU] | Freq: Once | SUBCUTANEOUS | Status: AC
  Administered 2015-11-02: 15 [IU] via SUBCUTANEOUS

## 2015-11-02 MED ORDER — INSULIN ASPART 100 UNIT/ML ~~LOC~~ SOLN
0.0000 [IU] | Freq: Three times a day (TID) | SUBCUTANEOUS | Status: DC
  Administered 2015-11-02: 11 [IU] via SUBCUTANEOUS
  Administered 2015-11-02: 8 [IU] via SUBCUTANEOUS
  Administered 2015-11-02 – 2015-11-03 (×3): 5 [IU] via SUBCUTANEOUS

## 2015-11-02 MED ORDER — HYDRALAZINE HCL 20 MG/ML IJ SOLN: 10.0000 mg | INTRAMUSCULAR | Status: DC | PRN

## 2015-11-02 MED ORDER — FUROSEMIDE 10 MG/ML IJ SOLN: 20.0000 mg | Freq: Once | INTRAMUSCULAR | Status: DC

## 2015-11-02 MED ORDER — FENTANYL CITRATE (PF) 100 MCG/2ML IJ SOLN
INTRAMUSCULAR | Status: DC | PRN
  Administered 2015-11-02 (×3): 50 ug via INTRAVENOUS

## 2015-11-02 MED ORDER — LIVING WELL WITH DIABETES BOOK
Freq: Once | Status: DC
  Filled 2015-11-02: qty 1

## 2015-11-02 MED ORDER — ACETAMINOPHEN 325 MG PO TABS
650.0000 mg | ORAL_TABLET | ORAL | Status: DC | PRN
  Administered 2015-11-03: 650 mg via ORAL
  Filled 2015-11-02: qty 2

## 2015-11-02 MED ORDER — INSULIN STARTER KIT- PEN NEEDLES (ENGLISH)
1.0000 | Freq: Once | Status: DC
  Filled 2015-11-02: qty 1

## 2015-11-02 MED ORDER — LIDOCAINE HCL (PF) 1 % IJ SOLN
INTRAMUSCULAR | Status: DC | PRN
  Administered 2015-11-02: 2 mL

## 2015-11-02 MED ORDER — NITROGLYCERIN 1 MG/10 ML FOR IR/CATH LAB
INTRA_ARTERIAL | Status: AC
  Filled 2015-11-02: qty 10

## 2015-11-02 MED ORDER — INSULIN GLARGINE 100 UNIT/ML ~~LOC~~ SOLN
15.0000 [IU] | Freq: Once | SUBCUTANEOUS | Status: AC
  Administered 2015-11-02: 15 [IU] via SUBCUTANEOUS
  Filled 2015-11-02: qty 0.15

## 2015-11-02 MED ORDER — SODIUM CHLORIDE 0.9% FLUSH
3.0000 mL | Freq: Two times a day (BID) | INTRAVENOUS | Status: DC
  Administered 2015-11-02 – 2015-11-03 (×3): 3 mL via INTRAVENOUS

## 2015-11-02 MED ORDER — MORPHINE SULFATE (PF) 2 MG/ML IV SOLN
2.0000 mg | INTRAVENOUS | Status: DC | PRN
  Administered 2015-11-02 (×2): 2 mg via INTRAVENOUS
  Filled 2015-11-02 (×2): qty 1

## 2015-11-02 MED ORDER — HEPARIN (PORCINE) IN NACL 2-0.9 UNIT/ML-% IJ SOLN
INTRAMUSCULAR | Status: DC | PRN
  Administered 2015-11-02: 1500 mL

## 2015-11-02 MED ORDER — MORPHINE SULFATE (PF) 2 MG/ML IV SOLN
2.0000 mg | INTRAVENOUS | Status: DC | PRN
  Administered 2015-11-02 (×3): 2 mg via INTRAVENOUS
  Filled 2015-11-02 (×3): qty 1

## 2015-11-02 MED ORDER — NITROGLYCERIN IN D5W 200-5 MCG/ML-% IV SOLN
0.0000 ug/min | INTRAVENOUS | Status: DC
  Administered 2015-11-02: 10 ug/min via INTRAVENOUS
  Filled 2015-11-02: qty 250

## 2015-11-02 MED ORDER — HEPARIN SODIUM (PORCINE) 1000 UNIT/ML IJ SOLN
INTRAMUSCULAR | Status: AC
  Filled 2015-11-02: qty 1

## 2015-11-02 MED ORDER — HEPARIN SODIUM (PORCINE) 1000 UNIT/ML IJ SOLN
INTRAMUSCULAR | Status: DC | PRN
  Administered 2015-11-02: 8000 [IU] via INTRAVENOUS
  Administered 2015-11-02: 3000 [IU] via INTRAVENOUS

## 2015-11-02 MED ORDER — IOPAMIDOL (ISOVUE-370) INJECTION 76%
INTRAVENOUS | Status: AC
  Filled 2015-11-02: qty 125

## 2015-11-02 MED ORDER — INSULIN ASPART 100 UNIT/ML ~~LOC~~ SOLN
15.0000 [IU] | Freq: Three times a day (TID) | SUBCUTANEOUS | Status: DC
  Administered 2015-11-02 – 2015-11-03 (×4): 15 [IU] via SUBCUTANEOUS

## 2015-11-02 MED ORDER — ATROPINE SULFATE 1 MG/10ML IJ SOSY
PREFILLED_SYRINGE | INTRAMUSCULAR | Status: AC
  Filled 2015-11-02: qty 10

## 2015-11-02 MED ORDER — IOPAMIDOL (ISOVUE-370) INJECTION 76%
INTRAVENOUS | Status: DC | PRN
  Administered 2015-11-02: 195 mL via INTRAVENOUS

## 2015-11-02 MED ORDER — INSULIN GLARGINE 100 UNIT/ML ~~LOC~~ SOLN
40.0000 [IU] | Freq: Every day | SUBCUTANEOUS | Status: DC
  Administered 2015-11-02: 40 [IU] via SUBCUTANEOUS
  Filled 2015-11-02 (×2): qty 0.4

## 2015-11-02 MED ORDER — NITROGLYCERIN 0.4 MG SL SUBL
0.4000 mg | SUBLINGUAL_TABLET | SUBLINGUAL | Status: DC | PRN
  Administered 2015-11-02: 0.4 mg via SUBLINGUAL
  Filled 2015-11-02: qty 1

## 2015-11-02 MED ORDER — PERFLUTREN LIPID MICROSPHERE
INTRAVENOUS | Status: AC
  Filled 2015-11-02: qty 10

## 2015-11-02 MED ORDER — MIDAZOLAM HCL 2 MG/2ML IJ SOLN
INTRAMUSCULAR | Status: AC
  Filled 2015-11-02: qty 2

## 2015-11-02 MED ORDER — INSULIN GLARGINE 100 UNIT/ML ~~LOC~~ SOLN
10.0000 [IU] | Freq: Every day | SUBCUTANEOUS | Status: DC
  Filled 2015-11-02: qty 0.1

## 2015-11-02 MED ORDER — IOPAMIDOL (ISOVUE-370) INJECTION 76%
INTRAVENOUS | Status: AC
  Filled 2015-11-02: qty 50

## 2015-11-02 MED ORDER — HEPARIN SODIUM (PORCINE) 5000 UNIT/ML IJ SOLN
4000.0000 [IU] | Freq: Once | INTRAMUSCULAR | Status: AC
  Administered 2015-11-02: 4000 [IU] via INTRAVENOUS

## 2015-11-02 MED ORDER — PRASUGREL HCL 10 MG PO TABS
ORAL_TABLET | ORAL | Status: DC | PRN
  Administered 2015-11-02: 60 mg via ORAL

## 2015-11-02 MED ORDER — ASPIRIN 81 MG PO CHEW
81.0000 mg | CHEWABLE_TABLET | Freq: Every day | ORAL | Status: DC
  Administered 2015-11-02 – 2015-11-03 (×2): 81 mg via ORAL
  Filled 2015-11-02 (×2): qty 1

## 2015-11-02 MED ORDER — LABETALOL HCL 5 MG/ML IV SOLN
INTRAVENOUS | Status: AC
  Filled 2015-11-02: qty 4

## 2015-11-02 MED ORDER — LIDOCAINE HCL (PF) 1 % IJ SOLN
INTRAMUSCULAR | Status: AC
  Filled 2015-11-02: qty 30

## 2015-11-02 MED ORDER — PRASUGREL HCL 10 MG PO TABS
ORAL_TABLET | ORAL | Status: AC
  Filled 2015-11-02: qty 6

## 2015-11-02 MED ORDER — ISOSORBIDE MONONITRATE ER 30 MG PO TB24
30.0000 mg | ORAL_TABLET | Freq: Every day | ORAL | Status: DC
  Administered 2015-11-02 – 2015-11-03 (×2): 30 mg via ORAL
  Filled 2015-11-02 (×2): qty 1

## 2015-11-02 MED ORDER — LABETALOL HCL 5 MG/ML IV SOLN
INTRAVENOUS | Status: DC | PRN
  Administered 2015-11-02: 10 mg via INTRAVENOUS

## 2015-11-02 MED ORDER — SODIUM CHLORIDE 0.9 % IV SOLN
INTRAVENOUS | Status: AC
  Administered 2015-11-02: 03:00:00 via INTRAVENOUS

## 2015-11-02 MED ORDER — ENOXAPARIN SODIUM 40 MG/0.4ML ~~LOC~~ SOLN
40.0000 mg | SUBCUTANEOUS | Status: DC
  Administered 2015-11-02: 40 mg via SUBCUTANEOUS
  Filled 2015-11-02: qty 0.4

## 2015-11-02 MED ORDER — INSULIN GLARGINE 100 UNIT/ML ~~LOC~~ SOLN
20.0000 [IU] | Freq: Once | SUBCUTANEOUS | Status: AC
  Administered 2015-11-02: 20 [IU] via SUBCUTANEOUS
  Filled 2015-11-02: qty 0.2

## 2015-11-02 MED ORDER — HEPARIN (PORCINE) IN NACL 2-0.9 UNIT/ML-% IJ SOLN
INTRAMUSCULAR | Status: AC
  Filled 2015-11-02: qty 1500

## 2015-11-02 MED ORDER — NITROGLYCERIN 1 MG/10 ML FOR IR/CATH LAB
INTRA_ARTERIAL | Status: DC | PRN
  Administered 2015-11-02 (×2): 200 ug via INTRACORONARY

## 2015-11-02 MED ORDER — LISINOPRIL 10 MG PO TABS
10.0000 mg | ORAL_TABLET | Freq: Every day | ORAL | Status: DC
  Administered 2015-11-02 – 2015-11-03 (×2): 10 mg via ORAL
  Filled 2015-11-02 (×2): qty 1

## 2015-11-02 MED ORDER — MORPHINE SULFATE (PF) 4 MG/ML IV SOLN
4.0000 mg | Freq: Once | INTRAVENOUS | Status: DC
  Filled 2015-11-02: qty 1

## 2015-11-02 MED ORDER — FENTANYL CITRATE (PF) 100 MCG/2ML IJ SOLN
INTRAMUSCULAR | Status: AC
  Filled 2015-11-02: qty 2

## 2015-11-02 MED ORDER — CARVEDILOL 3.125 MG PO TABS
3.1250 mg | ORAL_TABLET | Freq: Two times a day (BID) | ORAL | Status: DC
  Administered 2015-11-02: 3.125 mg via ORAL
  Filled 2015-11-02: qty 1

## 2015-11-02 MED ORDER — LISINOPRIL 2.5 MG PO TABS
2.5000 mg | ORAL_TABLET | Freq: Every day | ORAL | Status: DC
Start: 2015-11-02 — End: 2015-11-02

## 2015-11-02 MED ORDER — INSULIN ASPART 100 UNIT/ML ~~LOC~~ SOLN
0.0000 [IU] | Freq: Every day | SUBCUTANEOUS | Status: DC
  Administered 2015-11-02: 2 [IU] via SUBCUTANEOUS

## 2015-11-02 MED ORDER — ONDANSETRON HCL 4 MG/2ML IJ SOLN: 4.0000 mg | Freq: Four times a day (QID) | INTRAMUSCULAR | Status: DC | PRN

## 2015-11-02 MED ORDER — SODIUM CHLORIDE 0.9 % IV SOLN: 250.0000 mL | INTRAVENOUS | Status: DC | PRN

## 2015-11-02 MED ORDER — MIDAZOLAM HCL 2 MG/2ML IJ SOLN
INTRAMUSCULAR | Status: DC | PRN
  Administered 2015-11-02 (×3): 1 mg via INTRAVENOUS

## 2015-11-02 SURGICAL SUPPLY — 20 items
BALLN EUPHORA RX 3.0X15 (BALLOONS) ×2
BALLN ~~LOC~~ TREK RX 4.5X15 (BALLOONS) ×2
BALLOON EUPHORA RX 3.0X15 (BALLOONS) ×1 IMPLANT
BALLOON ~~LOC~~ TREK RX 4.5X15 (BALLOONS) ×1 IMPLANT
CATH INFINITI 5FR ANG PIGTAIL (CATHETERS) ×2 IMPLANT
CATH OPTITORQUE JACKY 4.0 5F (CATHETERS) ×2 IMPLANT
CATH VISTA GUIDE 6FR JR4 (CATHETERS) ×2 IMPLANT
DEVICE RAD COMP TR BAND LRG (VASCULAR PRODUCTS) ×2 IMPLANT
GLIDESHEATH SLEND SS 6F .021 (SHEATH) ×2 IMPLANT
HOVERMATT SINGLE USE (MISCELLANEOUS) ×2 IMPLANT
KIT ENCORE 26 ADVANTAGE (KITS) ×2 IMPLANT
KIT HEART LEFT (KITS) ×2 IMPLANT
PACK CARDIAC CATHETERIZATION (CUSTOM PROCEDURE TRAY) ×2 IMPLANT
STENT XIENCE ALPINE RX 4.0X15 (Permanent Stent) ×2 IMPLANT
STENT XIENCE ALPINE RX 4.0X23 (Permanent Stent) ×2 IMPLANT
SYR MEDRAD MARK V 150ML (SYRINGE) ×2 IMPLANT
TRANSDUCER W/STOPCOCK (MISCELLANEOUS) ×2 IMPLANT
TUBING CIL FLEX 10 FLL-RA (TUBING) ×2 IMPLANT
WIRE RUNTHROUGH .014X180CM (WIRE) ×2 IMPLANT
WIRE SAFE-T 1.5MM-J .035X260CM (WIRE) ×2 IMPLANT

## 2015-11-02 NOTE — Progress Notes (Signed)
  Echocardiogram 2D Echocardiogram with Definity  has been performed.  Leta JunglingCooper, Audriana Aldama M 11/02/2015, 3:30 PM

## 2015-11-02 NOTE — ED Provider Notes (Signed)
CSN: [id number]     Arrival date & time 11/02/15  0104 History  By signing my name below, I, Freida Busmaniana Omoyeni, attest that this documentation has been prepared under the direction and in the presence of Azalia BilisKevin Lesli Issa, MD . Electronically Signed: Freida Busmaniana Omoyeni, Scribe. 11/02/2015. 2:24 AM.   Chief Complaint  Patient presents with  . Chest Pain    The history is provided by the patient and the EMS personnel. No language interpreter was used.   HPI Comments:  Kirk Mckee is a 35 y.o. male brought in by ambulance, who presents to the Emergency Department complaining of non-radiating, central CP x 30-45 minutes. His pain began while he was driving. Pt describes his CP as a pressure. EMS reports associated nausea, vomiting and diaphoresis; they deny SOB. Pt denies h/o similar CP. He is a current smoker. He denies cocaine use, h/o HTN, HLD and is unsure of family cardiac history.  Pt was given ASA en route.   Past Medical History  Diagnosis Date  . Bursitis of knee    Past Surgical History  Procedure Laterality Date  . Arm sugery     No family history on file. Social History  Substance Use Topics  . Smoking status: Current Some Day Smoker -- 0.50 packs/day    Types: Cigarettes  . Smokeless tobacco: Not on file  . Alcohol Use: No    Review of Systems 10 systems reviewed and all are negative for acute change except as noted in the HPI.  Allergies  Naprosyn  Home Medications   Prior to Admission medications   Not on File   BP 178/122 mmHg  Pulse 80  Temp(Src) 98.9 F (37.2 C)  Resp 22  SpO2 100% Physical Exam  Constitutional: He is oriented to person, place, and time. He appears well-developed and well-nourished.  HENT:  Head: Normocephalic and atraumatic.  Eyes: EOM are normal.  Neck: Normal range of motion.  Cardiovascular: Normal rate, regular rhythm, normal heart sounds and intact distal pulses.   Pulmonary/Chest: Effort normal and breath sounds normal. No respiratory  distress.  Abdominal: Soft. He exhibits no distension. There is no tenderness.  Musculoskeletal: Normal range of motion.  Neurological: He is alert and oriented to person, place, and time.  Skin: Skin is warm and dry.  Psychiatric: He has a normal mood and affect. Judgment normal.  Nursing note and vitals reviewed.   ED Course  Procedures   DIAGNOSTIC STUDIES:  Oxygen Saturation is 100% on RA, normal by my interpretation.    COORDINATION OF CARE:  1:13 AM Pt will be taken to Cath Lab. Discussed treatment plan with pt at bedside and pt agreed to plan.  Labs Review Labs Reviewed  CBC - Abnormal; Notable for the following:    WBC 11.8 (*)    All other components within normal limits  APTT - Abnormal; Notable for the following:    aPTT <20 (*)    All other components within normal limits  COMPREHENSIVE METABOLIC PANEL - Abnormal; Notable for the following:    Sodium 133 (*)    CO2 20 (*)    Glucose, Bld 435 (*)    All other components within normal limits  LIPID PANEL - Abnormal; Notable for the following:    Cholesterol 319 (*)    Triglycerides 434 (*)    HDL 35 (*)    All other components within normal limits  I-STAT CHEM 8, ED - Abnormal; Notable for the following:    Glucose, Bld  415 (*)    Hemoglobin 17.3 (*)    All other components within normal limits  I-STAT CG4 LACTIC ACID, ED - Abnormal; Notable for the following:    Lactic Acid, Venous 4.74 (*)    All other components within normal limits  CBG MONITORING, ED - Abnormal; Notable for the following:    Glucose-Capillary 441 (*)    All other components within normal limits  DIFFERENTIAL  PROTIME-INR  TROPONIN I  I-STAT TROPOININ, ED  I-STAT CHEM 8, ED  I-STAT CG4 LACTIC ACID, ED    Imaging Review No results found. I have personally reviewed and evaluated these images and lab results as part of my medical decision-making.   EKG Interpretation   Date/Time:  Saturday November 02 2015 01:08:47  EDT Ventricular Rate:  73 PR Interval:    QRS Duration: 93 QT Interval:  363 QTC Calculation: 400 R Axis:   64 Text Interpretation:  Sinus arrhythmia Probable LVH with secondary repol  abnrm Inferior infarct, acute (RCA) Lateral leads are also involved ** **  ACUTE MI / STEMI ** ** No old tracing to compare Confirmed by Shelonda Saxe  MD,  Caryn Bee ([id number]) on 11/02/2015 2:22:21 AM      CRITICAL CARE Performed by: Azalia Bilis, MD Total critical care time: 30 minutes Critical care time was exclusive of separately billable procedures and treating other patients. Critical care was necessary to treat or prevent imminent or life-threatening deterioration. Critical care was time spent personally by me on the following activities: development of treatment plan with patient and/or surrogate as well as nursing, discussions with consultants, evaluation of patient's response to treatment, examination of patient, obtaining history from patient or surrogate, ordering and performing treatments and interventions, ordering and review of laboratory studies, ordering and review of radiographic studies, pulse oximetry and re-evaluation of patient's condition.  MDM   Final diagnoses:  ST elevation myocardial infarction (STEMI), unspecified artery [facility])    Patient presenting with acute ST elevation MI.  Blood pressure elevated but at this time we will take him to Cath Lab and cardiology requested that we hold blood pressure medication at this time.  No prior history of heart disease.  Doubt PE.  Doubt dissection.  He has no back pain or radiating symptoms.  He was describing chest pressure.  Aspirin given prior to arrival.  Nitroglycerin in the ER.  4000 unit heparin bolus.    I personally performed the services described in this documentation, which was scribed in my presence. The recorded information has been reviewed and is accurate.        Azalia Bilis, MD 11/02/15 [phone]

## 2015-11-02 NOTE — Progress Notes (Signed)
PATIENT ID: Kirk Mckee is a 26M with untreated hypertension and diabetes mellitus diagnosed this admission here with STEMI.  Now s/p DES to the RCA and residual R-PDA stenosis.    SUBJECTIVE:  Continues to have 2/10 chest tightness.  Also endorsed difficulty breathing when laying down last night.    PHYSICAL EXAM Filed Vitals:   11/02/15 0700 11/02/15 0800 11/02/15 0900 11/02/15 1000  BP: 132/92 136/87 145/97 140/101  Pulse: 59 53 51 75  Temp:  98.3 F (36.8 C)    TempSrc:  Oral    Resp: Height:      Weight:      SpO2: 99% 96% 98% 96%   General:  Well-appearing. No acute distress.  Neck: JVD 1-2cm above clavicle at 45 degrees.  Lungs:  CTAB Heart:  RRR.  No m/r/g.  Normal S1/S2 Abdomen:  Soft, NT, ND.  +BS Extremities:  WWP.  Trace edema  LABS: Lab Results  Component Value Date   TROPONINI <0.03 11/02/2015   Results for orders placed or performed during the hospital encounter of 11/02/15 (from the past 24 hour(s))  CBC     Status: Abnormal   Collection Time: 11/02/15  1:04 AM  Result Value Ref Range   WBC 11.8 (H) 4.0 - 10.5 K/uL   RBC 5.60 4.22 - 5.81 MIL/uL   Hemoglobin 15.6 13.0 - 17.0 g/dL   HCT 16.1 09.6 - 04.5 %   MCV 85.9 78.0 - 100.0 fL   MCH 27.9 26.0 - 34.0 pg   MCHC 32.4 30.0 - 36.0 g/dL   RDW 40.9 81.1 - 91.4 %   Platelets 244 150 - 400 K/uL  Differential     Status: None   Collection Time: 11/02/15  1:04 AM  Result Value Ref Range   Neutrophils Relative % 60 %   Lymphocytes Relative 30 %   Monocytes Relative 8 %   Eosinophils Relative 2 %   Basophils Relative 0 %   Neutro Abs 7.2 1.7 - 7.7 K/uL   Lymphs Abs 3.5 0.7 - 4.0 K/uL   Monocytes Absolute 0.9 0.1 - 1.0 K/uL   Eosinophils Absolute 0.2 0.0 - 0.7 K/uL   Basophils Absolute 0.0 0.0 - 0.1 K/uL   WBC Morphology ATYPICAL LYMPHOCYTES   Protime-INR     Status: None   Collection Time: 11/02/15  1:04 AM  Result Value Ref Range   Prothrombin Time 12.7 11.6 - 15.2 seconds   INR 0.94  0.00 - 1.49  APTT     Status: Abnormal   Collection Time: 11/02/15  1:04 AM  Result Value Ref Range   aPTT <20 (L) 24 - 37 seconds  Comprehensive metabolic panel     Status: Abnormal   Collection Time: 11/02/15  1:04 AM  Result Value Ref Range   Sodium 133 (L) 135 - 145 mmol/L   Potassium 4.3 3.5 - 5.1 mmol/L   Chloride 102 101 - 111 mmol/L   CO2 20 (L) 22 - 32 mmol/L   Glucose, Bld 435 (H) 65 - 99 mg/dL   BUN 8 6 - 20 mg/dL   Creatinine, Ser 7.82 0.61 - 1.24 mg/dL   Calcium 9.3 8.9 - 95.6 mg/dL   Total Protein 7.4 6.5 - 8.1 g/dL   Albumin 3.8 3.5 - 5.0 g/dL   AST 24 15 - 41 U/L   ALT 34 17 - 63 U/L   Alkaline Phosphatase 68 38 - 126 U/L   Total Bilirubin 0.7  0.3 - 1.2 mg/dL   GFR calc non Af Amer >60 >60 mL/min   GFR calc Af Amer >60 >60 mL/min   Anion gap 11 5 - 15  Troponin I     Status: None   Collection Time: 11/02/15  1:04 AM  Result Value Ref Range   Troponin I <0.03 <0.03 ng/mL  Lipid panel     Status: Abnormal   Collection Time: 11/02/15  1:04 AM  Result Value Ref Range   Cholesterol 319 (H) 0 - 200 mg/dL   Triglycerides 409 (H) <150 mg/dL   HDL 35 (L) >81 mg/dL   Total CHOL/HDL Ratio 9.1 RATIO   VLDL UNABLE TO CALCULATE IF TRIGLYCERIDE OVER 400 mg/dL 0 - 40 mg/dL   LDL Cholesterol UNABLE TO CALCULATE IF TRIGLYCERIDE OVER 400 mg/dL 0 - 99 mg/dL  I-stat troponin, ED     Status: None   Collection Time: 11/02/15  1:09 AM  Result Value Ref Range   Troponin i, poc 0.00 0.00 - 0.08 ng/mL   Comment 3          I-stat chem 8, ed     Status: Abnormal   Collection Time: 11/02/15  1:11 AM  Result Value Ref Range   Sodium 138 135 - 145 mmol/L   Potassium 4.2 3.5 - 5.1 mmol/L   Chloride 101 101 - 111 mmol/L   BUN 8 6 - 20 mg/dL   Creatinine, Ser 1.91 0.61 - 1.24 mg/dL   Glucose, Bld 478 (H) 65 - 99 mg/dL   Calcium, Ion 2.95 6.21 - 1.30 mmol/L   TCO2 22 0 - 100 mmol/L   Hemoglobin 17.3 (H) 13.0 - 17.0 g/dL   HCT 30.8 65.7 - 84.6 %  I-Stat CG4 Lactic Acid, ED      Status: Abnormal   Collection Time: 11/02/15  1:12 AM  Result Value Ref Range   Lactic Acid, Venous 4.74 (HH) 0.5 - 1.9 mmol/L   Comment NOTIFIED PHYSICIAN   CBG monitoring, ED     Status: Abnormal   Collection Time: 11/02/15  1:13 AM  Result Value Ref Range   Glucose-Capillary 441 (H) 65 - 99 mg/dL  POCT Activated clotting time     Status: None   Collection Time: 11/02/15  1:47 AM  Result Value Ref Range   Activated Clotting Time 213 seconds  POCT Activated clotting time     Status: None   Collection Time: 11/02/15  2:01 AM  Result Value Ref Range   Activated Clotting Time 296 seconds  MRSA PCR Screening     Status: None   Collection Time: 11/02/15  2:53 AM  Result Value Ref Range   MRSA by PCR NEGATIVE NEGATIVE  Basic metabolic panel     Status: Abnormal   Collection Time: 11/02/15  3:34 AM  Result Value Ref Range   Sodium 134 (L) 135 - 145 mmol/L   Potassium 3.9 3.5 - 5.1 mmol/L   Chloride 102 101 - 111 mmol/L   CO2 20 (L) 22 - 32 mmol/L   Glucose, Bld 421 (H) 65 - 99 mg/dL   BUN 8 6 - 20 mg/dL   Creatinine, Ser 9.62 0.61 - 1.24 mg/dL   Calcium 9.1 8.9 - 95.2 mg/dL   GFR calc non Af Amer >60 >60 mL/min   GFR calc Af Amer >60 >60 mL/min   Anion gap 12 5 - 15  CBC     Status: Abnormal   Collection Time: 11/02/15  3:34 AM  Result Value Ref Range   WBC 14.1 (H) 4.0 - 10.5 K/uL   RBC 5.43 4.22 - 5.81 MIL/uL   Hemoglobin 15.2 13.0 - 17.0 g/dL   HCT 30.8 65.7 - 84.6 %   MCV 86.0 78.0 - 100.0 fL   MCH 28.0 26.0 - 34.0 pg   MCHC 32.5 30.0 - 36.0 g/dL   RDW 96.2 95.2 - 84.1 %   Platelets 269 150 - 400 K/uL  Glucose, capillary     Status: Abnormal   Collection Time: 11/02/15  4:14 AM  Result Value Ref Range   Glucose-Capillary 451 (H) 65 - 99 mg/dL   Comment 1 Capillary Specimen   Lipid panel     Status: Abnormal   Collection Time: 11/02/15  4:20 AM  Result Value Ref Range   Cholesterol 298 (H) 0 - 200 mg/dL   Triglycerides 324 (H) <150 mg/dL   HDL 36 (L) >40 mg/dL    Total CHOL/HDL Ratio 8.3 RATIO   VLDL 36 0 - 40 mg/dL   LDL Cholesterol 102 (H) 0 - 99 mg/dL  Lactic acid, plasma     Status: Abnormal   Collection Time: 11/02/15  5:35 AM  Result Value Ref Range   Lactic Acid, Venous 2.3 (HH) 0.5 - 1.9 mmol/L  Glucose, capillary     Status: Abnormal   Collection Time: 11/02/15  8:42 AM  Result Value Ref Range   Glucose-Capillary 314 (H) 65 - 99 mg/dL   Comment 1 Capillary Specimen     Intake/Output Summary (Last 24 hours) at 11/02/15 1031 Last data filed at 11/02/15 0800  Gross per 24 hour  Intake 1470.39 ml  Output   1000 ml  Net 470.39 ml    Telemetry: sinus rhythm, sinus bradycardia.  No events  LHC 11/02/15:  RPDA lesion, 100% stenosed.  Mid RCA to Dist RCA lesion, 100% stenosed. Post intervention, there is a 0% residual stenosis.  There is mild to moderate left ventricular systolic dysfunction.  1. Severe one-vessel coronary artery disease with thrombotic occlusion of the mid to distal right coronary artery with evidence of embolization into the distal right PDA. No other obstructive disease. 2. Mild to moderate reduction of LV systolic function with an ejection fraction of 35-45% with distal inferior wall hypokinesis. 3. Successful balloon angioplasty and drug-eluting stent placement to the mid to distal right coronary artery. A second overlapping stent was placed more proximally due to proximal edge dissection.   ASSESSMENT AND PLAN:  Active Problems:   ST elevation myocardial infarction (STEMI) (HCC)   ST elevation myocardial infarction (STEMI) of inferior wall [facility])   # CAD s/p STEMI: Kirk Mckee was found to have 100% RCA and PDA lesions.  The RCA lesion was successfully stented.  There was dissection of the proximal edge that was also stented.  The PDA lesion was not amenable to PCI.  He continues to have chest discomfort.  Will start Imdur 30 mg daily. Continue aspirin, prasugrel, atorvastatin 80mg , and carvedilol.  Cannot titrate  carvedilol due to bradycardia.  Will attempt to d/c nitroglycerin drip.  Change morphine to 2mg  q2h prn.   # Hypertensive heart disease: BP remains poorly-controlled.  Lisinopril increased to 10 mg.  Continue carvedilol.  He reports knowing that he had hypertension but was not on any medications.   # Acute systolic and diastolic heart failure: LVgram showed LVEF 35-45% and inferior hypokinesis.  Echo pending.  Carvedilol and lisinopril as above. Will give lasix 20mg  IV x1 for report  of orthopnea.   # Hyperlipidemia: Atorvastatin started this admission.  LDL 226.  # Diabetes mellitus type 2: Newly-diagnosed.  Glucose is poorly-controlled on SSI. Will start Lantus 40 units +15 units of aspart tidcc.  Will give an additional 20 units of Lantus now, as he only got 15 units last night.  Continue SSI.  Will consult Diabetes Management for further recommendations.     Alysson Geist C. Duke Salviaandolph, MD, [facility] VillageFACC 11/02/2015 10:31 AM

## 2015-11-02 NOTE — Progress Notes (Signed)
   11/02/15 0100  Clinical Encounter Type  Visited With Family  Visit Type ED  Referral From Nurse  Consult/Referral To Chaplain  Spiritual Encounters  Spiritual Needs Emotional  Stress Factors  Patient Stress Factors Health changes  Family Stress Factors Health changes;Loss of control   Chaplain responded to ED. 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  Family came in shortly after he arrived.  Patient taken to cath lab.  Chaplain escorted family to cath lab waiting room and offered ministry of hospitality.  [name] 11/02/2015 1:57 AM

## 2015-11-02 NOTE — ED Notes (Signed)
Patient belongings including brown wallet, cell phone, and flip flops given to Sister in law Darla LeschesKimberly Johnson [phone]

## 2015-11-02 NOTE — Progress Notes (Signed)
Inpatient Diabetes Program Recommendations  AACE/ADA: New Consensus Statement on Inpatient Glycemic Control (2015)  Target Ranges:  Prepandial:   less than 140 mg/dL      Peak postprandial:   less than 180 mg/dL (1-2 hours)      Critically ill patients:  140 - 180 mg/dL   Lab Results  Component Value Date   GLUCAP 314* 11/02/2015   Results for ALQUAN, MORRISH (MRN [id number]) as of 11/02/2015 11:33  Ref. Range 11/02/2015 01:13 11/02/2015 04:14 11/02/2015 08:42  Glucose-Capillary Latest Ref Range: 65-99 mg/dL 441 (H) 451 (H) 314 (H)   Review of Glycemic Control  Diabetes history: None - Newly-diagnosed this admission Outpatient Diabetes medications: None Current orders for Inpatient glycemic control: Lantus 40 QHS, Novolog moderate tidwc and HS +15 units tidwc  HgbA1C pending.  Inpatient Diabetes Program Recommendations:    To begin Lantus 40 units QHS No results found for: HIV1RNAQUANT Novolog moderate tidwc and hs Novolog meal coverage of 15 units tidwc. Pt will likely need to go home on insulin.  Titrate Lantus if FBS > 180 mg/dL Titrate meal coverage Novolog if post-prandial blood sugars > 180 mg/dL. Need case manager consult for medication coverage of insulin - What is insulin pen co-pay? Will order Living Well With Diabetes book and Insulin Pen Starter Kit Order diabetes videos on pt ed channel RN to begin teaching insulin pen administration with Insulin pen station. Allow pt to monitor his blood sugar by sticking finger. Stress importance of lifestyle changes with diet, exercise, smoking, and stress management.  Will follow closely. Awaiting HgbA1C results.  Thank you. Lorenda Peck, RD, LDN, CDE Inpatient Diabetes Coordinator [phone]

## 2015-11-02 NOTE — Progress Notes (Addendum)
CRITICAL VALUE ALERT  Critical value received:  Lactic Acid 2.3, Troponin >65  Date of notification:  11/02/15  Time of notification:  1230  Critical value read back:Yes.    Nurse who received alert:  Dayna BarkerLauren Christoher Drudge, RN  MD notified (1st page):  Dr. Duke Salviaandolph aware of results  Time of first page:  1230  MD notified (2nd page):  Time of second page:  Responding MD:  Dr. Thad Rangereynolds  Time MD responded:  1230

## 2015-11-02 NOTE — H&P (Signed)
CC: Chest pain  HPI: Obese 35 yo AA man with no known CAD, presents to ER via EMS with c/o CP. Started while driving, sudden onset, severe, pressure/tighntss, non-radiating, associated with diaphoresis and N/V. No syncope. Smokes tobacco. No prior history of similar symptoms. Not on any medications on regular basis. Denies substance abuse. EMS ECG suggested inf ST elevation which remained the same on repeat ECG in the ER. His blood glucose is over 400 although he does not have a diagnosis of DM until now.    Review of Systems:  10 systems reviewed unremarkable except as noted in my HPI    Past Medical History  Diagnosis Date  . Bursitis of knee     No current facility-administered medications on file prior to encounter.   No current outpatient prescriptions on file prior to encounter.     Allergies  Allergen Reactions  . Naprosyn [Naproxen]     Social History   Social History  . Marital Status: Single    Spouse Name: N/A  . Number of Children: N/A  . Years of Education: N/A   Occupational History  . Not on file.   Social History Main Topics  . Smoking status: Current Some Day Smoker -- 0.50 packs/day    Types: Cigarettes  . Smokeless tobacco: Not on file  . Alcohol Use: No  . Drug Use: No  . Sexual Activity: Yes   Other Topics Concern  . Not on file   Social History Narrative    No family history on file.  PHYSICAL EXAM: Filed Vitals:   11/02/15 0116 11/02/15 0117  BP: 178/122   Pulse: 80   Temp:  98.9 F (37.2 C)  Resp: 22    General:  Obese; anxious,  HEENT: normal Neck: supple. no JVD. Carotids 2+ bilat; no bruits. No lymphadenopathy or thryomegaly appreciated. Cor: PMI nondisplaced. Regular rate & rhythm. No rubs, gallops or murmurs. Lungs: clear Abdomen: soft, nontender, nondistended. No hepatosplenomegaly. No bruits or masses. Good bowel sounds. Extremities: no cyanosis, clubbing, rash, edema Neuro: alert & oriented x 3, cranial nerves  grossly intact. moves all 4 extremities w/o difficulty. Affect pleasant.  ECG: Sinus rhythm, inf ST elevation, narrow QRS    Results for orders placed or performed during the hospital encounter of 11/02/15 (from the past 24 hour(s))  CBC     Status: Abnormal   Collection Time: 11/02/15  1:04 AM  Result Value Ref Range   WBC 11.8 (H) 4.0 - 10.5 K/uL   RBC 5.60 4.22 - 5.81 MIL/uL   Hemoglobin 15.6 13.0 - 17.0 g/dL   HCT 16.148.1 09.639.0 - 04.552.0 %   MCV 85.9 78.0 - 100.0 fL   MCH 27.9 26.0 - 34.0 pg   MCHC 32.4 30.0 - 36.0 g/dL   RDW 40.912.9 81.111.5 - 91.415.5 %   Platelets 244 150 - 400 K/uL  Differential     Status: None (Preliminary result)   Collection Time: 11/02/15  1:04 AM  Result Value Ref Range   Neutrophils Relative % PENDING %   Neutro Abs PENDING 1.7 - 7.7 K/uL   Band Neutrophils PENDING %   Lymphocytes Relative PENDING %   Lymphs Abs PENDING 0.7 - 4.0 K/uL   Monocytes Relative PENDING %   Monocytes Absolute PENDING 0.1 - 1.0 K/uL   Eosinophils Relative PENDING %   Eosinophils Absolute PENDING 0.0 - 0.7 K/uL   Basophils Relative PENDING %   Basophils Absolute PENDING 0.0 - 0.1 K/uL  WBC Morphology PENDING    RBC Morphology PENDING    Smear Review PENDING    nRBC PENDING 0 /100 WBC   Metamyelocytes Relative PENDING %   Myelocytes PENDING %   Promyelocytes Absolute PENDING %   Blasts PENDING %  Protime-INR     Status: None   Collection Time: 11/02/15  1:04 AM  Result Value Ref Range   Prothrombin Time 12.7 11.6 - 15.2 seconds   INR 0.94 0.00 - 1.49  I-stat troponin, ED     Status: None   Collection Time: 11/02/15  1:09 AM  Result Value Ref Range   Troponin i, poc 0.00 0.00 - 0.08 ng/mL   Comment 3          I-stat chem 8, ed     Status: Abnormal   Collection Time: 11/02/15  1:11 AM  Result Value Ref Range   Sodium 138 135 - 145 mmol/L   Potassium 4.2 3.5 - 5.1 mmol/L   Chloride 101 101 - 111 mmol/L   BUN 8 6 - 20 mg/dL   Creatinine, Ser 1.61 0.61 - 1.24 mg/dL   Glucose,  Bld 096 (H) 65 - 99 mg/dL   Calcium, Ion 0.45 4.09 - 1.30 mmol/L   TCO2 22 0 - 100 mmol/L   Hemoglobin 17.3 (H) 13.0 - 17.0 g/dL   HCT 81.1 91.4 - 78.2 %  I-Stat CG4 Lactic Acid, ED     Status: Abnormal   Collection Time: 11/02/15  1:12 AM  Result Value Ref Range   Lactic Acid, Venous 4.74 (HH) 0.5 - 1.9 mmol/L   Comment NOTIFIED PHYSICIAN   CBG monitoring, ED     Status: Abnormal   Collection Time: 11/02/15  1:13 AM  Result Value Ref Range   Glucose-Capillary 441 (H) 65 - 99 mg/dL   No results found.   ASSESSMENT:  1. STEMI, inf wall by ECG - not in shock or acute HF - CAD risk factors: obese, smoker, male, and likely undiagnosed DM   PLAN/DISCUSSION:  ASA was given by EMS In the ER, Heparin bolus given Also received NTG sl x1 in the ER due to persisting symptoms  He will need further counseling for smoking cessation, weight loss, healthy diet and exercise He will need further management of probable DM   Emergent cath. Quickly discussed risks, benefits and alternatives. Questions answered. He wishes to proceed with the plan of cath and angioplasty/stent if needed.   Patient transported to the cath lab and handed over to the on call interventional team.     Nevin Bloodgood, MD Cardiology

## 2015-11-02 NOTE — ED Notes (Signed)
Pt received 1 sublingual nitro, 4000U heprin by ED RN. EMS gave 324 ASA enroute.

## 2015-11-03 ENCOUNTER — Encounter (HOSPITAL_COMMUNITY): Payer: Self-pay | Admitting: Physician Assistant

## 2015-11-03 ENCOUNTER — Other Ambulatory Visit: Payer: Self-pay | Admitting: Internal Medicine

## 2015-11-03 DIAGNOSIS — I255 Ischemic cardiomyopathy: Secondary | ICD-10-CM | POA: Diagnosis present

## 2015-11-03 DIAGNOSIS — I2119 ST elevation (STEMI) myocardial infarction involving other coronary artery of inferior wall: Principal | ICD-10-CM

## 2015-11-03 DIAGNOSIS — R001 Bradycardia, unspecified: Secondary | ICD-10-CM | POA: Diagnosis present

## 2015-11-03 DIAGNOSIS — D72829 Elevated white blood cell count, unspecified: Secondary | ICD-10-CM

## 2015-11-03 DIAGNOSIS — Z9861 Coronary angioplasty status: Secondary | ICD-10-CM

## 2015-11-03 DIAGNOSIS — I2510 Atherosclerotic heart disease of native coronary artery without angina pectoris: Secondary | ICD-10-CM

## 2015-11-03 DIAGNOSIS — E876 Hypokalemia: Secondary | ICD-10-CM

## 2015-11-03 DIAGNOSIS — Z720 Tobacco use: Secondary | ICD-10-CM | POA: Diagnosis present

## 2015-11-03 DIAGNOSIS — E669 Obesity, unspecified: Secondary | ICD-10-CM | POA: Diagnosis present

## 2015-11-03 DIAGNOSIS — F17201 Nicotine dependence, unspecified, in remission: Secondary | ICD-10-CM | POA: Diagnosis present

## 2015-11-03 LAB — BASIC METABOLIC PANEL
Anion gap: 9 (ref 5–15)
BUN: 7 mg/dL (ref 6–20)
CO2: 24 mmol/L (ref 22–32)
Calcium: 9.2 mg/dL (ref 8.9–10.3)
Chloride: 101 mmol/L (ref 101–111)
Creatinine, Ser: 0.83 mg/dL (ref 0.61–1.24)
GFR calc Af Amer: >60 mL/min (ref >60)
GFR calc non Af Amer: >60 mL/min (ref >60)
Glucose, Bld: 194 mg/dL — ABNORMAL HIGH (ref 65–99)
Potassium: 3.4 mmol/L — ABNORMAL LOW (ref 3.5–5.1)
Sodium: 134 mmol/L — ABNORMAL LOW (ref 135–145)

## 2015-11-03 LAB — CBC
HCT: 44.5 % (ref 39.0–52.0)
Hemoglobin: 14.2 g/dL (ref 13.0–17.0)
MCH: 28 pg (ref 26.0–34.0)
MCHC: 31.9 g/dL (ref 30.0–36.0)
MCV: 87.6 fL (ref 78.0–100.0)
Platelets: 248 10*3/uL (ref 150–400)
RBC: 5.08 MIL/uL (ref 4.22–5.81)
RDW: 13.2 % (ref 11.5–15.5)
WBC: 17.7 10*3/uL — ABNORMAL HIGH (ref 4.0–10.5)

## 2015-11-03 LAB — GLUCOSE, CAPILLARY
Glucose-Capillary: 213 mg/dL — ABNORMAL HIGH (ref 65–99)
Glucose-Capillary: 215 mg/dL — ABNORMAL HIGH (ref 65–99)

## 2015-11-03 MED ORDER — ISOSORBIDE MONONITRATE ER 30 MG PO TB24: 30.0000 mg | ORAL_TABLET | Freq: Every day | ORAL | Status: DC

## 2015-11-03 MED ORDER — ASPIRIN 81 MG PO TBEC: 81.0000 mg | DELAYED_RELEASE_TABLET | Freq: Every day | ORAL | Status: AC

## 2015-11-03 MED ORDER — POTASSIUM CHLORIDE CRYS ER 20 MEQ PO TBCR
40.0000 meq | EXTENDED_RELEASE_TABLET | Freq: Once | ORAL | Status: AC
  Administered 2015-11-03: 40 meq via ORAL
  Filled 2015-11-03: qty 2

## 2015-11-03 MED ORDER — POTASSIUM CHLORIDE CRYS ER 20 MEQ PO TBCR: 20.0000 meq | EXTENDED_RELEASE_TABLET | Freq: Every day | ORAL | Status: DC

## 2015-11-03 MED ORDER — CARVEDILOL 3.125 MG PO TABS: 3.1250 mg | ORAL_TABLET | Freq: Two times a day (BID) | ORAL | Status: DC

## 2015-11-03 MED ORDER — ATORVASTATIN CALCIUM 80 MG PO TABS: 80.0000 mg | ORAL_TABLET | Freq: Every evening | ORAL | Status: DC

## 2015-11-03 MED ORDER — LISINOPRIL 10 MG PO TABS: 10.0000 mg | ORAL_TABLET | Freq: Every day | ORAL | Status: DC

## 2015-11-03 MED ORDER — INSULIN ASPART 100 UNIT/ML ~~LOC~~ SOLN: 18.0000 [IU] | Freq: Three times a day (TID) | SUBCUTANEOUS | Status: DC

## 2015-11-03 MED ORDER — ASPIRIN EC 81 MG PO TBEC: 81.0000 mg | DELAYED_RELEASE_TABLET | Freq: Every day | ORAL | Status: DC

## 2015-11-03 MED ORDER — INSULIN GLARGINE 100 UNIT/ML ~~LOC~~ SOLN: 44.0000 [IU] | Freq: Every day | SUBCUTANEOUS | Status: DC

## 2015-11-03 MED ORDER — FUROSEMIDE 20 MG PO TABS
20.0000 mg | ORAL_TABLET | Freq: Every day | ORAL | Status: DC
  Administered 2015-11-03: 20 mg via ORAL
  Filled 2015-11-03: qty 1

## 2015-11-03 MED ORDER — LIVING BETTER WITH HEART FAILURE BOOK: Freq: Once | Status: DC

## 2015-11-03 MED ORDER — NITROGLYCERIN 0.4 MG SL SUBL: 0.4000 mg | SUBLINGUAL_TABLET | SUBLINGUAL | Status: DC | PRN

## 2015-11-03 MED ORDER — PRASUGREL HCL 10 MG PO TABS: 10.0000 mg | ORAL_TABLET | Freq: Every day | ORAL | Status: DC

## 2015-11-03 MED ORDER — INSULIN GLARGINE 100 UNIT/ML ~~LOC~~ SOLN
44.0000 [IU] | Freq: Every day | SUBCUTANEOUS | Status: DC
  Filled 2015-11-03: qty 0.44

## 2015-11-03 MED ORDER — FUROSEMIDE 20 MG PO TABS: 20.0000 mg | ORAL_TABLET | Freq: Every day | ORAL | Status: DC

## 2015-11-03 MED ORDER — NICOTINE 21 MG/24HR TD PT24: MEDICATED_PATCH | TRANSDERMAL | Status: DC

## 2015-11-03 NOTE — Discharge Summary (Signed)
Discharge Summary    Patient ID: Kirk Mckee,  MRN: [id number], DOB/AGE: Feb 12, 1981 35 y.o.  Admit date: 11/02/2015 Discharge date: 11/03/2015  Primary Care Provider: No primary care provider on file. Primary Cardiologist: Duke Salvia   Discharge Diagnoses    Principal Problem:   ST elevation myocardial infarction (STEMI) of inferior wall (HCC) Active Problems:   Diabetes mellitus type 2, uncontrolled (HCC)   Hypertensive heart disease   Hyperlipidemia   Acute combined systolic and diastolic heart failure (HCC)   Sinus bradycardia   Ischemic cardiomyopathy   Obesity   Tobacco abuse   CAD in native artery   Leukocytosis   Hypokalemia    Diagnostic Studies/Procedures    1. Cardiac catheterization this admission, please see full report and below for summary. 2. 2D echo 11/02/15: - Left ventricle: The cavity size was normal. Systolic function was  mildly to moderately reduced. The estimated ejection fraction was  in the range of 40% to 45%. Moderate hypokinesis of the inferior,  inferoseptal, and apical myocardium; consistent with ischemia in  the distribution of the right coronary artery. The tissue Doppler  parameters were mildly abnormal for his young age. Left  ventricular diastolic function parameters were normal. Acoustic  contrast opacification revealed no evidence ofthrombus. _____________   History of Present Illness & Hospital Course    Kirk Mckee is a 35 yo obese male tobacco user with h/o knee bursitis and no other known PMH who presented to [facility] with acute onset of chest pain. It started while driving, sudden onset, severe, pressure/tighntess, non-radiating, associated with diaphoresis and N/V. He called EMS and EKG suggested inf ST elevation thus code STEMI was called. CBG>400. LHC 11/02/15: thrombotic occlusion of the mid to distal right coronary artery with evidence of embolization into the distal right PDA. No other obstructive disease. He subsequently had  successful balloon angioplasty and drug-eluting stent placement to the mid to distal right coronary artery. A second overlapping stent was placed more proximally due to proximal edge dissection. LVEF 35-40% with distal inf wall HK. It was recommended to use DAPT for at least 1 year. The right PDA occlusion was distal and the vessel was small in that area and not suitable for balloon angioplasty. During his admission he was given  IV Lasix for acute sys/diastolic CHF/hypertensive heart disease. He was started on insulin at the direction of the diabetes coordinator. His meds were titrated for CAD - Imdur was added for residual discomfort along with ASA, prasugrel, atorvastatin , ACEI and carvedilol.BB could not be titrated due to sinus bradycardia. 2D Echo 11/02/15: EF 40-45%, moderate HK of inferior/inferoseptal/apical myocardium, normal diastolic parameters. Tobacco cessation was advised and the patient requested rx for nicotine patch at discharge. Cardiac rehab did not see him since he was admitted over the weekend - I sent a message to Ethelda Chick with cardiac rehab to plug him into the system. Dr. Duke Salvia has seen and examined the patient today and feels he is stable for discharge. I have sent a message to our [facility] office's scheduler requesting a follow-up appointment, and our office will call the patient with this information.  Issues for follow-up: - his WBC was 14k on admission, rising to 17k on this morning's labs. Consider CBC at f/u appt. - K was 3.4 following Lasix administration, which was repleted. It was 4.3 on admission. Sent home on Lasix 20 + KCl 20. Would obtain BMET at f/u appt. - LDL was 226. If the patient is tolerating statin at  time of follow-up appointment, would consider rechecking liver function/lipid panel in 6-8 weeks. If he is compliant with meds would refer to lipid clinic in follow-up.  See diabetes/CM notes - patient was provided with handwritten perscription for  diabetic supplies including glucometer, test strips, lancets and advised to f/u PCP for further management of his DM. The diabetes educator also gave him a log book to record his sugars and will contact him tomorrow to follow up. Diabetes coordinator ordered OP Diabetes Education consult for newly-diagnosed DM. Also got 30 day free Effient rx and living better with CHF book. _____________  Discharge Vitals Blood pressure 109/76, pulse 53, temperature 98.6 F (37 C), temperature source Oral, resp. rate 23, height 6' (1.829 m), weight 296 lb 15.4 oz (134.7 kg), SpO2 96 %.  Filed Weights   11/02/15 0245  Weight: 296 lb 15.4 oz (134.7 kg)    Labs & Radiologic Studies    CBC  Recent Labs  11/02/15 0104  11/02/15 0334 11/03/15 0545  WBC 11.8*  --  14.1* 17.7*  NEUTROABS 7.2  --   --   --   HGB 15.6  < > 15.2 14.2  HCT 48.1  < > 46.7 44.5  MCV 85.9  --  86.0 87.6  PLT 244  --  269 248  < > = values in this interval not displayed. Basic Metabolic Panel  Recent Labs  11/02/15 0334 11/03/15 0545  NA 134* 134*  K 3.9 3.4*  CL 102 101  CO2 20* 24  GLUCOSE 421* 194*  BUN 8 7  CREATININE 0.99 0.83  CALCIUM 9.1 9.2   Liver Function Tests  Recent Labs  11/02/15 0104  AST 24  ALT 34  ALKPHOS 68  BILITOT 0.7  PROT 7.4  ALBUMIN 3.8   Cardiac Enzymes  Recent Labs  11/02/15 0104 11/02/15 1110  TROPONINI <0.03 >65.00*    Fasting Lipid Panel  Recent Labs  11/02/15 0420  CHOL 298*  HDL 36*  LDLCALC 226*  TRIG 181*  CHOLHDL 8.3    No results found. Disposition   Pt is being discharged home today in good condition.  Follow-up Plans & Appointments    Follow-up Information    Follow up with [facility].   Why:  CALL THIS NUMBER, FOLLOW THE PROMPTS TO GET A LISTING FOR A PRIMARY CARE PHYSICIAN   Contact information:   [phone]      Follow up with Chilton Si, MD.   Specialty:  Cardiology   Why:  Office will call you for your followup  appointment. Call office if you have not heard back in 3 days. Please make sure to clarify which location your appointment will be at - we have several locations.   Contact information:   [address] [place] [address] [phone]      Discharge Instructions    AMB Referral to Cardiac Rehabilitation - Phase II    Complete by:  As directed   Diagnosis:   STEMI PTCA       Ambulatory referral to Nutrition and Diabetic Education    Complete by:  As directed      Diet - low sodium heart healthy    Complete by:  As directed      Increase activity slowly    Complete by:  As directed   No driving for 2 weeks. No lifting over 10 lbs for 4 weeks. No sexual activity for 4 weeks. You may not return to work until  cleared by your cardiologist - we will discuss this at your follow-up visit in approximately 1 week. Keep procedure site clean & dry. If you notice increased pain, swelling, bleeding or pus, call/return!  You may shower, but no soaking baths/hot tubs/pools for 1 week.   One of your heart tests showed weakness of the heart muscle this admission. This may make you more susceptible to weight gain from fluid retention, which can lead to symptoms that we call heart failure. Follow these special instructions:  1. Follow a low-salt diet and watch your fluid intake. In general, you should not be taking in more than 2 liters of fluid per day (no more than 8 glasses per day). Some patients are restricted to less than 1.5 liters of fluid per day (no more than 6 glasses per day). This includes sources of water in foods like soup, coffee, tea, milk, etc. 2. Weigh yourself on the same scale at same time of day and keep a log. 3. Call your doctor: (Anytime you feel any of the following symptoms)  - 3-4 pound weight gain in 1-2 days or 2 pounds overnight  - Shortness of breath, with or without a dry hacking cough  - Swelling in the hands, feet or stomach  - If you have to sleep on extra  pillows at night in order to breathe  IT IS IMPORTANT TO LET YOUR DOCTOR KNOW EARLY ON IF YOU ARE HAVING SYMPTOMS SO WE CAN HELP YOU!           Discharge Medications   Current Discharge Medication List    START taking these medications   Details  aspirin EC 81 MG EC tablet Take 1 tablet (81 mg total) by mouth daily. Qty: 30 tablet, Refills: 11    atorvastatin (LIPITOR) 80 MG tablet Take 1 tablet (80 mg total) by mouth every evening. Qty: 30 tablet, Refills: 6    carvedilol (COREG) 3.125 MG tablet Take 1 tablet (3.125 mg total) by mouth 2 (two) times daily with a meal. Qty: 60 tablet, Refills: 6    furosemide (LASIX) 20 MG tablet Take 1 tablet (20 mg total) by mouth daily. Qty: 30 tablet, Refills: 3    insulin aspart (NOVOLOG) 100 UNIT/ML injection Inject 18 Units into the skin 3 (three) times daily with meals. Qty: 10 mL, Refills: 1    insulin glargine (LANTUS) 100 UNIT/ML injection Inject 0.44 mLs (44 Units total) into the skin at bedtime. Qty: 10 mL, Refills: 1    isosorbide mononitrate (IMDUR) 30 MG 24 hr tablet Take 1 tablet (30 mg total) by mouth daily. Qty: 30 tablet, Refills: 6    lisinopril (PRINIVIL,ZESTRIL) 10 MG tablet Take 1 tablet (10 mg total) by mouth daily. Qty: 30 tablet, Refills: 6    nicotine (NICODERM CQ - DOSED IN MG/24 HOURS) 21 mg/24hr patch Apply 21mg  patch one daily for 6 weeks (dispense #42), then change to 14mg  patch one daily for 2 weeks (dispense #14), then change to 7mg  patch one daily for 2 weeks (dispense #14). Remove old patch before applying new one. Qty: 70 patch, Refills: 0    nitroGLYCERIN (NITROSTAT) 0.4 MG SL tablet Place 1 tablet (0.4 mg total) under the tongue every 5 (five) minutes as needed for chest pain (up to 3 doses). Qty: 25 tablet, Refills: 3    potassium chloride SA (K-DUR,KLOR-CON) 20 MEQ tablet Take 1 tablet (20 mEq total) by mouth daily. Qty: 30 tablet, Refills: 3    prasugrel (EFFIENT) 10 MG  TABS tablet Take 1 tablet  (10 mg total) by mouth daily. Qty: 30 tablet, Refills: 11         Allergies:  Allergies  Allergen Reactions  . Naprosyn [Naproxen]     Aspirin prescribed at discharge?  Yes High Intensity Statin Prescribed? (Lipitor 40-80mg  or Crestor 20-40mg ): Yes Beta Blocker Prescribed? Yes For EF <40%, was ACEI/ARB Prescribed? Yes ADP Receptor Inhibitor Prescribed? (i.e. Plavix etc.-Includes Medically Managed Patients): Yes For EF <40%, Aldosterone Inhibitor Prescribed? No: na Was EF assessed during THIS hospitalization? Yes Was Cardiac Rehab II ordered? (Included Medically managed Patients): sent message to cardiac rehab tech   Outstanding Labs/Studies   See above  Duration of Discharge Encounter   Greater than 30 minutes including physician time.  Signed, Laurann Montana PA-C 11/03/2015, 4:42 PM

## 2015-11-03 NOTE — Progress Notes (Signed)
PATIENT ID: Kirk Mckee is a 39M with untreated hypertension and diabetes mellitus diagnosed this admission here with STEMI.  Now s/p DES to the RCA and residual R-PDA stenosis.    SUBJECTIVE:  Feels well.  Denies chest pain or shortness of breath.     PHYSICAL EXAM Filed Vitals:   11/03/15 0700 11/03/15 0800 11/03/15 0848 11/03/15 0900  BP: 113/79 112/79  108/72  Pulse: 57 52 58 57  Temp:  98.6 F (37 C)    TempSrc:  Oral    Resp: Height:      Weight:      SpO2: 98% 99%  95%   General:  Well-appearing. No acute distress.  Neck: No JVD at 45 degrees.  Lungs:  CTAB Heart:  RRR.  No m/r/g.  Normal S1/S2 Abdomen:  Soft, NT, ND.  +BS Extremities:  WWP.  No edema  LABS: Lab Results  Component Value Date   TROPONINI >65.00* 11/02/2015   Results for orders placed or performed during the hospital encounter of 11/02/15 (from the past 24 hour(s))  Lactic acid, plasma     Status: Abnormal   Collection Time: 11/02/15 11:10 AM  Result Value Ref Range   Lactic Acid, Venous 2.3 (HH) 0.5 - 1.9 mmol/L  Troponin I     Status: Abnormal   Collection Time: 11/02/15 11:10 AM  Result Value Ref Range   Troponin I >65.00 (HH) <0.03 ng/mL  Glucose, capillary     Status: Abnormal   Collection Time: 11/02/15 11:34 AM  Result Value Ref Range   Glucose-Capillary 295 (H) 65 - 99 mg/dL   Comment 1 Capillary Specimen   Glucose, capillary     Status: Abnormal   Collection Time: 11/02/15  5:13 PM  Result Value Ref Range   Glucose-Capillary 240 (H) 65 - 99 mg/dL   Comment 1 Capillary Specimen   Glucose, capillary     Status: Abnormal   Collection Time: 11/02/15  9:14 PM  Result Value Ref Range   Glucose-Capillary 242 (H) 65 - 99 mg/dL   Comment 1 Capillary Specimen   Basic metabolic panel     Status: Abnormal   Collection Time: 11/03/15  5:45 AM  Result Value Ref Range   Sodium 134 (L) 135 - 145 mmol/L   Potassium 3.4 (L) 3.5 - 5.1 mmol/L   Chloride 101 101 - 111 mmol/L   CO2 24  22 - 32 mmol/L   Glucose, Bld 194 (H) 65 - 99 mg/dL   BUN 7 6 - 20 mg/dL   Creatinine, Ser 1.61 0.61 - 1.24 mg/dL   Calcium 9.2 8.9 - 09.6 mg/dL   GFR calc non Af Amer >60 >60 mL/min   GFR calc Af Amer >60 >60 mL/min   Anion gap 9 5 - 15  CBC     Status: Abnormal   Collection Time: 11/03/15  5:45 AM  Result Value Ref Range   WBC 17.7 (H) 4.0 - 10.5 K/uL   RBC 5.08 4.22 - 5.81 MIL/uL   Hemoglobin 14.2 13.0 - 17.0 g/dL   HCT 04.5 40.9 - 81.1 %   MCV 87.6 78.0 - 100.0 fL   MCH 28.0 26.0 - 34.0 pg   MCHC 31.9 30.0 - 36.0 g/dL   RDW 91.4 78.2 - 95.6 %   Platelets 248 150 - 400 K/uL  Glucose, capillary     Status: Abnormal   Collection Time: 11/03/15  8:07 AM  Result Value Ref Range  Glucose-Capillary 213 (H) 65 - 99 mg/dL   Comment 1 Capillary Specimen     Intake/Output Summary (Last 24 hours) at 11/03/15 0932 Last data filed at 11/02/15 2200  Gross per 24 hour  Intake  803.2 ml  Output   2000 ml  Net -1196.8 ml    Telemetry: sinus rhythm, sinus bradycardia.  No events  LHC 11/02/15:  RPDA lesion, 100% stenosed.  Mid RCA to Dist RCA lesion, 100% stenosed. Post intervention, there is a 0% residual stenosis.  There is mild to moderate left ventricular systolic dysfunction.  1. Severe one-vessel coronary artery disease with thrombotic occlusion of the mid to distal right coronary artery with evidence of embolization into the distal right PDA. No other obstructive disease. 2. Mild to moderate reduction of LV systolic function with an ejection fraction of 35-45% with distal inferior wall hypokinesis. 3. Successful balloon angioplasty and drug-eluting stent placement to the mid to distal right coronary artery. A second overlapping stent was placed more proximally due to proximal edge dissection.  Echo 11/02/15: Study Conclusions  - Left ventricle: The cavity size was normal. Systolic function was  mildly to moderately reduced. The estimated ejection fraction was  in the  range of 40% to 45%. Moderate hypokinesis of the inferior,  inferoseptal, and apical myocardium; consistent with ischemia in  the distribution of the right coronary artery. The tissue Doppler  parameters were mildly abnormal for his young age. Left  ventricular diastolic function parameters were normal. Acoustic  contrast opacification revealed no evidence ofthrombus.   ASSESSMENT AND PLAN:  Active Problems:   ST elevation myocardial infarction (STEMI) (HCC)   ST elevation myocardial infarction (STEMI) of inferior wall (HCC)   Diabetes mellitus type 2 in obese [facility](HCC)   Hypertensive heart disease   Hyperlipidemia   Acute combined systolic and diastolic heart failure (HCC)   # CAD s/p STEMI: Kirk Mckee was found to have 100% RCA and PDA lesions.  The RCA lesion was successfully stented.  There was dissection of the proximal edge that was also stented.  The PDA lesion was not amenable to PCI.  He had some residual chest discomfort and was started on Imdur 30 mg daily.  Chest pain has resolved.  Continue aspirin, prasugrel, atorvastatin 80mg , and carvedilol.  Cannot titrate carvedilol due to bradycardia.    # Hypertensive heart disease: BP now well-controlled on lisinopril and carvedilol. He reports knowing that he had hypertension but was not on any medications.   # Acute systolic and diastolic heart failure: Echo revealed LVEF 40-45% with hypokinesis of the inferior, inferoseptal and apical myocardium.  Carvedilol and lisinopril as above. Volume status is improved after 20 mg IV lasix.  Will start lasix 20mg  po daily.   # Hyperlipidemia: Atorvastatin started this admission.  LDL 226.  # Diabetes mellitus type 2: Newly-diagnosed.  He was started on lantus and aspart yesterday and glucose is now in the 200s.  Diabetes Coordinator is following.  Will await their recommendations prior to discharge.  Dispo: Stable for discharge from cardiovascular standpoint if OK with ambulation.  Will  await DM recs.  Gesenia Bantz C. Duke Salviaandolph, MD, [facility] Center[facility]ACC 11/03/2015 9:32 AM

## 2015-11-03 NOTE — Progress Notes (Signed)
Inpatient Diabetes Program Recommendations  AACE/ADA: New Consensus Statement on Inpatient Glycemic Control (2015)  Target Ranges:  Prepandial:   less than 140 mg/dL      Peak postprandial:   less than 180 mg/dL (1-2 hours)      Critically ill patients:  140 - 180 mg/dL   Lab Results  Component Value Date   GLUCAP 215* 11/03/2015   Results for [name], Kirk (MRN [id number]) as of 11/03/2015 15:33  Ref. Range 11/02/2015 11:34 11/02/2015 17:13 11/02/2015 21:14 11/03/2015 08:07 11/03/2015 11:40  Glucose-Capillary Latest Ref Range: 65-99 mg/dL 191295 (H) 478240 (H) 295242 (H) 213 (H) 215 (H)   Review of Glycemic Control  Blood sugars still remain in 200s.  Needs insulin adjustment.  Consider increasing Lantus to 44 units QHS Consider increasing Novolog to 18 units tidwc. Will need to f/u with PCP for management of diabetes. Take logbook to MD appt for any needed insulin adjustments. Will order OP Diabetes Education consult for newly-diagnosed DM. Will f/u with phone call on 7/10 to see if pt has any further questions.  Needs prescription for meter and supplies, insulin pen needles and insulin pens (Lantus and Novolog)  Thank you. Ailene Ardshonda Everlean Bucher, RD, LDN, CDE Inpatient Diabetes Coordinator [phone]

## 2015-11-03 NOTE — Progress Notes (Signed)
CM received call from RN requesting med asst for newly diagnosed DM. CM spoke with pt and spouse of pt who verify pt has insurance with [employer] and, unfortunately, does not meet criteria for med asst with [employer].  Family states they are able to afford medication with their current insurance plan.  CM gave resource information to secure a PCP; CM gave resource information for Re[employer] glucometer wich [facility] less than 30.00.  CM has requested RN notify MD for prescription for Nicotine patch (or other cessation med for nicotine) and to please remember to write prescription for insulin pen with needles.  No other CM needs were communicated.

## 2015-11-04 ENCOUNTER — Telehealth (HOSPITAL_COMMUNITY): Payer: Self-pay | Admitting: Nurse Practitioner

## 2015-11-04 ENCOUNTER — Telehealth: Payer: Self-pay | Admitting: Cardiovascular Disease

## 2015-11-04 ENCOUNTER — Telehealth: Payer: Self-pay | Admitting: *Deleted

## 2015-11-04 ENCOUNTER — Encounter (HOSPITAL_COMMUNITY): Payer: Self-pay | Admitting: Cardiovascular Disease

## 2015-11-04 LAB — HEMOGLOBIN A1C
Hgb A1c MFr Bld: 10.6 % — ABNORMAL HIGH (ref 4.8–5.6)
Mean Plasma Glucose: 258 mg/dL

## 2015-11-04 MED FILL — Atropine Sulfate Soln Prefill Syr 1 MG/10ML (0.1 MG/ML): INTRAMUSCULAR | Qty: 10 | Status: AC

## 2015-11-04 NOTE — Telephone Encounter (Signed)
30 day free trial card for Effient 10mg  provided to patient. Also, copy of Insurance card needed for a PA.

## 2015-11-04 NOTE — Telephone Encounter (Signed)
New Message  Pt wife Called requesting to speak with RN about fax to get prescription filled for Prasugrel 10mg . Pt wife States he does not have insurance to get the med. And wants to know if pt could get a sample for today. Please call back to dicuss

## 2015-11-04 NOTE — Telephone Encounter (Signed)
Patient's wife notified no samples are available. Advised she contact our office on [address] to inquire about samples

## 2015-11-05 ENCOUNTER — Other Ambulatory Visit: Payer: Self-pay

## 2015-11-05 ENCOUNTER — Telehealth: Payer: Self-pay

## 2015-11-05 ENCOUNTER — Telehealth: Payer: Self-pay | Admitting: Cardiovascular Disease

## 2015-11-05 MED ORDER — INSULIN GLARGINE 100 UNIT/ML ~~LOC~~ SOLN: 44.0000 [IU] | Freq: Every day | SUBCUTANEOUS | Status: DC

## 2015-11-05 MED ORDER — INSULIN ASPART 100 UNIT/ML ~~LOC~~ SOLN: 18.0000 [IU] | Freq: Three times a day (TID) | SUBCUTANEOUS | Status: DC

## 2015-11-05 NOTE — Telephone Encounter (Signed)
If you wanted to change him to Brilinta, we have a lot of that.

## 2015-11-05 NOTE — Telephone Encounter (Signed)
Will forward to Candy SledgeLinda R LPN with PA department

## 2015-11-05 NOTE — Telephone Encounter (Signed)
Spoke with patient's wife. She states that her mother-in-law paid for the Effient out of pocket. I have [place]. Also, resent rx for Lantus and Humalog back [facility] [place], at wife's request.

## 2015-11-05 NOTE — Telephone Encounter (Signed)
Effient samples not available in office. I've done a PA, but he has been without meds since discharge. Also Insurance prefers Humalog over [place], and Levimir over Lantus. Please respond ASAP

## 2015-11-05 NOTE — Telephone Encounter (Signed)
Follow up      Wife is calling anxiously wanting to know where we stand on prior authorization for his effient.  Pt has not had any since he left the hosp Sunday.  No one has samples and the free card they received cannot be activated until ins is approved.  Please call and give wife an update

## 2015-11-05 NOTE — Telephone Encounter (Signed)
New message      Talk to a nurse about a prior authorization on effient

## 2015-11-05 NOTE — Telephone Encounter (Signed)
Prior [place] submitted to [employer] for Effient 10mg . Rx for his Lantus and Humalog sent back to [facility]'. They did not [facility] to [facility].

## 2015-11-06 ENCOUNTER — Other Ambulatory Visit: Payer: Self-pay

## 2015-11-06 MED ORDER — INSULIN LISPRO 100 UNIT/ML ~~LOC~~ SOLN: 18.0000 [IU] | Freq: Three times a day (TID) | SUBCUTANEOUS | Status: DC

## 2015-11-06 MED ORDER — INSULIN DETEMIR 100 UNIT/ML ~~LOC~~ SOLN: 44.0000 [IU] | Freq: Every day | SUBCUTANEOUS | Status: DC

## 2015-11-06 NOTE — Telephone Encounter (Signed)
OK to change Effient to Brilinta 90mg  BID. I saw subsequent phone notes that he has actually been taking Effient due to paying out of pocket - please confirm as otherwise we will have to reload the Brilinta - he should NOT EVER GO without any doses of these medicines. OK to change Lantus to Levemir and Novolog to Humalog - these are unit for unit conversions so keep same RX as before. ThxKriste Basque. Niyonna Betsill PA-C

## 2015-11-06 NOTE — Telephone Encounter (Signed)
I called patient's wife this am. Pharmacy gave them 2 pills (Effient 10mg ) yesterday, and I got the approval today. So I will assume he can remain on it instead of switching to Brilinta. Thanks for your assistance.

## 2015-11-11 ENCOUNTER — Telehealth: Payer: Self-pay | Admitting: *Deleted

## 2015-11-11 NOTE — Telephone Encounter (Signed)
Received request from Cardiac Rehab Filled out and faxed back to [phone]

## 2015-11-14 ENCOUNTER — Encounter (INDEPENDENT_AMBULATORY_CARE_PROVIDER_SITE_OTHER): Payer: Self-pay

## 2015-11-14 ENCOUNTER — Ambulatory Visit (INDEPENDENT_AMBULATORY_CARE_PROVIDER_SITE_OTHER): Payer: Managed Care, Other (non HMO) | Admitting: Physician Assistant

## 2015-11-14 ENCOUNTER — Encounter: Payer: Self-pay | Admitting: *Deleted

## 2015-11-14 ENCOUNTER — Encounter: Payer: Self-pay | Admitting: Physician Assistant

## 2015-11-14 VITALS — BP 104/70 | HR 76 | Ht 72.0 in | Wt 299.0 lb

## 2015-11-14 DIAGNOSIS — I2510 Atherosclerotic heart disease of native coronary artery without angina pectoris: Secondary | ICD-10-CM | POA: Diagnosis not present

## 2015-11-14 DIAGNOSIS — Z720 Tobacco use: Secondary | ICD-10-CM

## 2015-11-14 DIAGNOSIS — I2119 ST elevation (STEMI) myocardial infarction involving other coronary artery of inferior wall: Secondary | ICD-10-CM | POA: Diagnosis not present

## 2015-11-14 DIAGNOSIS — D72829 Elevated white blood cell count, unspecified: Secondary | ICD-10-CM

## 2015-11-14 DIAGNOSIS — I255 Ischemic cardiomyopathy: Secondary | ICD-10-CM

## 2015-11-14 DIAGNOSIS — I5042 Chronic combined systolic (congestive) and diastolic (congestive) heart failure: Secondary | ICD-10-CM

## 2015-11-14 DIAGNOSIS — E785 Hyperlipidemia, unspecified: Secondary | ICD-10-CM | POA: Diagnosis not present

## 2015-11-14 DIAGNOSIS — Z9861 Coronary angioplasty status: Secondary | ICD-10-CM

## 2015-11-14 LAB — CBC
HCT: 39.3 % (ref 38.5–50.0)
Hemoglobin: 13 g/dL — ABNORMAL LOW (ref 13.2–17.1)
MCH: 28.3 pg (ref 27.0–33.0)
MCHC: 33.1 g/dL (ref 32.0–36.0)
MCV: 85.4 fL (ref 80.0–100.0)
MPV: 10 fL (ref 7.5–12.5)
Platelets: 334 10*3/uL (ref 140–400)
RBC: 4.6 MIL/uL (ref 4.20–5.80)
RDW: 13.2 % (ref 11.0–15.0)
WBC: 13.1 10*3/uL — ABNORMAL HIGH (ref 3.8–10.8)

## 2015-11-14 LAB — BASIC METABOLIC PANEL
BUN: 11 mg/dL (ref 7–25)
CO2: 25 mmol/L (ref 20–31)
Calcium: 9.1 mg/dL (ref 8.6–10.3)
Chloride: 101 mmol/L (ref 98–110)
Creat: 0.89 mg/dL (ref 0.60–1.35)
Glucose, Bld: 198 mg/dL — ABNORMAL HIGH (ref 65–99)
Potassium: 4 mmol/L (ref 3.5–5.3)
Sodium: 135 mmol/L (ref 135–146)

## 2015-11-14 NOTE — Patient Instructions (Addendum)
Medication Instructions:  Your physician recommends that you continue on your current medications as directed. Please refer to the Current Medication list given to you today.   Labwork: TODAY:  CBC & BMET 6-8 WEEKS @ TIME OF APPT:  FASTING LIPID & CMET  Testing/Procedures: None ordered  Follow-Up: Your physician recommends that you schedule a follow-up appointment in: 6-8 WEEKS WITH DR. Preston   Any Other Special Instructions Will Be Listed Below (If Applicable).  NO LIFTING OVER 10 LBS UNTIL 12/01/15 YOU MAY RETURN TO WORK 11/25/15 IF YOU ARE FEELING WELL, WITH THE ABOVE RESTRICTIONS   If you need a refill on your cardiac medications before your next appointment, please call your pharmacy.

## 2015-11-14 NOTE — Progress Notes (Signed)
Cardiology Office Note    Date:  11/14/2015  ID:  Kirk Mckee, DOB June 22, 1980, MRN [id number] PCP:  No primary care provider on file.  Cardiologist: Duke Salviaandolph    Chief Complaint: f/u stemi  History of Present Illness:  Kirk Mckee is a 35 y.o. male with history of obesity, tobacco abuse, recently noted CAD (STEMI s/p overlapping PTCA/DES to mid-distal RCA), ICM/chronic combined CHF, sinus bradycardia who presents for post-hospital follow-up. He was recently admitted 10/2015 with acute STEMI and found to have CBG>400. LHC 11/02/2015: s/p PTCA/DES to mid-distal RCA with overlapping stent due to proximal edge dissection, right PDA occlusion too small for PCI, EF 35-40%. He was treated with brief dose of IV Lasix for acute combined CHF. Imdur was added for residual discomfort along with ASA, prasugrel, atorvastatin 80mg , ACEI and carvedilol.BB could not be titrated due to sinus bradycardia. 2D Echo 11/02/15: EF 40-45%, moderate HK of inferior/inferoseptal/apical myocardium, normal diastolic parameters. LDL 226. He required Lasix at discharge. He was also given new DM medications with instructions to f/u PCP for further mgmt.  He returns for follow-up feeling well. No CP, SOB. He saw PCP yesterday to work on DM. Only 2 sugars >200 since discharge. He and his wife are working on cooking differently and cutting out salt. He was shocked when he saw how much sodium was in foods. He inquires as to whether marijuana will help his heart condition. He plans to participate in cardiac rehab.   Past Medical History  Diagnosis Date  . Bursitis of knee   . Diabetes mellitus type 2 in obese (HCC)   . Hypertensive heart disease   . Hyperlipidemia   . Acute combined systolic and diastolic heart failure (HCC)     a. 10/2015: EF 35-40% by cath, 40-45% by echo.  Marland Kitchen. CAD (coronary artery disease)     a. 10/2015: STEMI s/p PTCA/DES to mid-distal RCA with overlapping stent due to proximal edge dissection, right PDA  occlusion too small for PCI.  Marland Kitchen. Hyperlipidemia   . Tobacco abuse   . Ischemic cardiomyopathy   . Obesity   . Sinus bradycardia     a. HR 50s prevented further med titration of BB.    Past Surgical History  Procedure Laterality Date  . Arm sugery    . Cardiac catheterization N/A 11/02/2015    Procedure: Left Heart Cath and Coronary Angiography;  Surgeon: Iran OuchMuhammad A Arida, MD;  [facility];  Service: Cardiovascular;  Laterality: N/A;  . Cardiac catheterization N/A 11/02/2015    Procedure: Coronary Stent Intervention;  Surgeon: Iran OuchMuhammad A Arida, MD;  Location: [facility];  Service: Cardiovascular;  Laterality: N/A;    Current Medications: Current Outpatient Prescriptions  Medication Sig Dispense Refill  . aspirin EC 81 MG EC tablet Take 1 tablet (81 mg total) by mouth daily. 30 tablet 11  . atorvastatin (LIPITOR) 80 MG tablet Take 1 tablet (80 mg total) by mouth every evening. 30 tablet 6  . carvedilol (COREG) 3.125 MG tablet Take 1 tablet (3.125 mg total) by mouth 2 (two) times daily with a meal. 60 tablet 6  . furosemide (LASIX) 20 MG tablet Take 1 tablet (20 mg total) by mouth daily. 30 tablet 3  . insulin detemir (LEVEMIR) 100 UNIT/ML injection Inject 0.44 mLs (44 Units total) into the skin at bedtime. 10 mL 1  . insulin lispro (HUMALOG) 100 UNIT/ML injection Inject 0.18 mLs (18 Units total) into the skin 3 (three) times daily before meals.  10 mL 1  . isosorbide mononitrate (IMDUR) 30 MG 24 hr tablet Take 1 tablet (30 mg total) by mouth daily. 30 tablet 6  . lisinopril (PRINIVIL,ZESTRIL) 10 MG tablet Take 1 tablet (10 mg total) by mouth daily. 30 tablet 6  . nitroGLYCERIN (NITROSTAT) 0.4 MG SL tablet Place 1 tablet (0.4 mg total) under the tongue every 5 (five) minutes as needed for chest pain (up to 3 doses). 25 tablet 3  . potassium chloride SA (K-DUR,KLOR-CON) 20 MEQ tablet Take 1 tablet (20 mEq total) by mouth daily. 30 tablet 3  . prasugrel (EFFIENT) 10 MG  TABS tablet Take 1 tablet (10 mg total) by mouth daily. 30 tablet 11   No current facility-administered medications for this visit.     Allergies:   Naprosyn   Social History   Social History  . Marital Status: Single    Spouse Name: N/A  . Number of Children: N/A  . Years of Education: N/A   Social History Main Topics  . Smoking status: Current Some Day Smoker -- 0.50 packs/day    Types: Cigarettes  . Smokeless tobacco: None  . Alcohol Use: No  . Drug Use: No  . Sexual Activity: Yes   Other Topics Concern  . None   Social History Narrative     Family History:  The patient's family history includes Hypertension in his mother.   ROS:   Please see the history of present illness. All other systems are reviewed and otherwise negative.    PHYSICAL EXAM:   VS:  BP 104/70 mmHg  Pulse 76  Ht 6' (1.829 m)  Wt 299 lb (135.626 kg)  BMI 40.54 kg/m2  BMI: Body mass index is 40.54 kg/(m^2). GEN: Well nourished, well developed obese AAM, in no acute distress HEENT: normocephalic, atraumatic Neck: no JVD, carotid bruits, or masses Cardiac: RRR; no murmurs, rubs, or gallops, no edema  Respiratory:  clear to auscultation bilaterally, normal work of breathing GI: soft, nontender, nondistended, + BS MS: no deformity or atrophy Skin: warm and dry, no rash, right radial cath site without hematoma or ecchymosis; good pulse. Neuro:  Alert and Oriented x 3, Strength and sensation are intact, follows commands Psych: euthymic mood, full affect  Wt Readings from Last 3 Encounters:  11/14/15 299 lb (135.626 kg)  11/02/15 296 lb 15.4 oz (134.7 kg)  05/15/14 320 lb (145.151 kg)      Studies/Labs Reviewed:   EKG:  EKG was ordered today and personally reviewed by me and demonstratesNSR 76bpm, hyperacute T waves persistent V1-V2, upsloped ST i ,avL, TWI inferiorly and V6. Prior inferior infarct as evidenced by inferior Q waves.  Recent Labs: 11/02/2015: ALT 34 11/03/2015: BUN 7;  Creatinine, Ser 0.83; Hemoglobin 14.2; Platelets 248; Potassium 3.4*; Sodium 134*   Lipid Panel    Component Value Date/Time   CHOL 298* 11/02/2015 0420   TRIG 181* 11/02/2015 0420   HDL 36* 11/02/2015 0420   CHOLHDL 8.3 11/02/2015 0420   VLDL 36 11/02/2015 0420   LDLCALC 226* 11/02/2015 0420    Additional studies/ records that were reviewed today include: Summarized above.    ASSESSMENT & PLAN:   1. CAD with recent STEMI as above - doing well. He hasn't had any other trouble getting his Effient (see phone notes). Continue ASA, Effient, BB, statin. He plans to enroll in cardiac rehab. 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 I think based on how well  he's done, he should be able to return to work next Monday on 11/25/15 if there are no complications with gradually increasing activity (keeping the restriction of no lifting >10 lbs for 4 weeks which ends 12/01/15). I told him I would not advise marijuana use in the setting of his CAD.  2. Hyperlipidemia - recheck lipids/CMET in 6-8 weeks when he returns for follow-up. If LDL not at goal, would refer to the lipid clinic for further management. 3. Tobacco abuse - counseled. 4. Chronic combined CHF/ICM - appears euvolemic. Recheck BMET today. Reviewed low sodium diet, daily weights, obs for CHF sx. 5. Leukocytosis - suspect due to MI but will recheck today.  Disposition: F/u with Dr. Duke Salvia in 6-8 weeks with recheck labs as above.   Medication Adjustments/Labs and Tests Ordered: Current medicines are reviewed at length with the patient today.  Concerns regarding medicines are outlined above. Medication changes, Labs and Tests ordered today are summarized above and listed in the Patient Instructions accessible in Encounters.   Thomasene Mohair PA-C  11/14/2015 1:26 PM    [facility] [address] [place], [place], [place]  [address] Phone: [phone]; Fax: [phone]

## 2015-11-15 ENCOUNTER — Telehealth: Payer: Self-pay | Admitting: *Deleted

## 2015-11-15 ENCOUNTER — Encounter: Payer: Self-pay | Admitting: *Deleted

## 2015-11-15 NOTE — Telephone Encounter (Signed)
-----   Message from Laurann Montanaayna N Dunn, [place] sent at 11/15/2015  6:30 AM EDT ----- Please call patient. Labs look OK except blood sugar 198 (he has newly diagnosed DM) and white blood cell count continues to be high. At this point I don't think we can blame the WBC on his heart attack since it's persisted - he needs to discuss further workup of this with his PCP (WBC is nonspecific and can be from infection, inflammation, dysfunction of the bone marrow and several other issues). ThxKriste Basque. Dayna Dunn PA-C

## 2015-11-15 NOTE — Telephone Encounter (Signed)
Spoke to pt about lab results being okay with elevated blood sugar. The patient mentioned he remembered he has just eaten a meal before the appt along with acknowledgement of new diagnosis of DM and the importance of that. Pt was explained his WBC ranges per provider Lucile Craterana Dunn is not to blame for recent heart attack from her analysis due to persistently being out of range.and could possibly come from and infection, inflammation, or other issues but would need to follow up with his pcp for further evaluation for that. Pt couldn't remember pcp but remember location of closest [address] [facility] Dr Wylene Simmerisovec which labs was Routed via epic. pcp added in pt chart also.

## 2015-11-29 ENCOUNTER — Telehealth (HOSPITAL_COMMUNITY): Payer: Self-pay | Admitting: *Deleted

## 2015-11-29 NOTE — Telephone Encounter (Signed)
Called pt as requested after work hours.  Pt walking and beginning some running. Pt denies any cp or sob.  Pt unable to participate in cardiac rehab due to his work schedule.  Pt works 7-4 daily.  Advised pt that if circumstances changed to please re contact cardiac rehab. Alanson Aly, BSN

## 2015-12-13 ENCOUNTER — Telehealth: Payer: Self-pay | Admitting: *Deleted

## 2015-12-13 NOTE — Telephone Encounter (Signed)
Received [employer] employee health certification form for patient  Filled and faxed, confirmation received

## 2016-01-02 ENCOUNTER — Encounter: Payer: Self-pay | Admitting: Cardiovascular Disease

## 2016-01-02 ENCOUNTER — Ambulatory Visit (INDEPENDENT_AMBULATORY_CARE_PROVIDER_SITE_OTHER): Payer: Managed Care, Other (non HMO) | Admitting: Cardiovascular Disease

## 2016-01-02 VITALS — BP 123/73 | HR 61 | Ht 72.0 in | Wt 295.0 lb

## 2016-01-02 DIAGNOSIS — I2510 Atherosclerotic heart disease of native coronary artery without angina pectoris: Secondary | ICD-10-CM

## 2016-01-02 DIAGNOSIS — I10 Essential (primary) hypertension: Secondary | ICD-10-CM

## 2016-01-02 DIAGNOSIS — E785 Hyperlipidemia, unspecified: Secondary | ICD-10-CM

## 2016-01-02 DIAGNOSIS — Z9861 Coronary angioplasty status: Secondary | ICD-10-CM

## 2016-01-02 DIAGNOSIS — I255 Ischemic cardiomyopathy: Secondary | ICD-10-CM | POA: Diagnosis not present

## 2016-01-02 NOTE — Patient Instructions (Signed)
Medication Instructions:  STOP ISOSORBIDE (IMDUR)  Labwork: CMET/LP AT [facility] ON THE FIRST FLOOR  Testing/Procedures: NONE  Follow-Up: Your physician wants you to follow-up in: 6 MONTH OV You will receive a reminder letter in the mail two months in advance. If you don't receive a letter, please call our office to schedule the follow-up appointment.  If you need a refill on your cardiac medications before your next appointment, please call your pharmacy.

## 2016-01-02 NOTE — Progress Notes (Signed)
Cardiology Office Note   Date:  01/02/2016   ID:  Kirk Mckee, DOB Feb 13, 1981, MRN [id number]  PCP:  Gaspar Garbeichard W Tisovec, MD  Cardiologist:   Kirk Mckee , MD   Chief Complaint  Patient presents with  . Follow-up    Pt states no Sx.  Pt wants to know if  he can go back to his normal hours of working .    History of Present Illness: Kirk BouchardJerome Mckee is a 35 y.o. male with diabetes mellitus type 2, hypertension, hyperlipidemia, CAD s/p STEMI, tobacco abuse, and chronic systolic and diastolic heart failure who presents for follow up.  Kirk Mckee was admitted 10/2015 with a STEMI and had a DES to the mid-distal RCA with overlapping stent due to proximal edge dissection.  There was also an occlusion of the PDA that was too small for PCI.  He was also diagnosed with diabetes during that hospitalization with glucose >400.  His hemoglobin A1c was 10.6%.  Echo revealed LVEF 40-45% with moderate hypokinesis of the inferior/inferoseptal/apical myocardium.  He followed up with Kirk Spiesayna Dunn, PA-C, on 11/14/15 and was doing well.  Since then he continues to do well.  He has changed his diet and significantly limited fried and fatty foods.  His A1c was down to 6.5% when he saw his PCP recently.  He was running up to 0.25 miles daily.  However, he stopped doing this because he was afraid that he would have another heart attack.  He did not have any chest pain or shortness of breath with that activity.  He has not noticed any lower extremity edema, orthopnea or PND.  He continues to limit salt intake.  He has not been smoking.  Past Medical History:  Diagnosis Date  . Acute combined systolic and diastolic heart failure (HCC)    a. 10/2015: EF 35-40% by cath, 40-45% by echo.  . Bursitis of knee   . CAD (coronary artery disease)    a. 10/2015: STEMI s/p PTCA/DES to mid-distal RCA with overlapping stent due to proximal edge dissection, right PDA occlusion too small for PCI.  . Diabetes mellitus type 2 in obese (HCC)    . Hyperlipidemia   . Hyperlipidemia   . Hypertensive heart disease   . Ischemic cardiomyopathy   . Obesity   . Sinus bradycardia    a. HR 50s prevented further med titration of BB.  . Tobacco abuse     Past Surgical History:  Procedure Laterality Date  . arm sugery    . CARDIAC CATHETERIZATION N/A 11/02/2015   Procedure: Left Heart Cath and Coronary Angiography;  Surgeon: Kirk OuchMuhammad A Arida, MD;  [facility];  Service: Cardiovascular;  Laterality: N/A;  . CARDIAC CATHETERIZATION N/A 11/02/2015   Procedure: Coronary Stent Intervention;  Surgeon: Kirk OuchMuhammad A Arida, MD;  Location: [facility];  Service: Cardiovascular;  Laterality: N/A;     Current Outpatient Prescriptions  Medication Sig Dispense Refill  . aspirin EC 81 MG EC tablet Take 1 tablet (81 mg total) by mouth daily. 30 tablet 11  . atorvastatin (LIPITOR) 80 MG tablet Take 1 tablet (80 mg total) by mouth every evening. 30 tablet 6  . carvedilol (COREG) 3.125 MG tablet Take 1 tablet (3.125 mg total) by mouth 2 (two) times daily with a meal. 60 tablet 6  . furosemide (LASIX) 20 MG tablet Take 1 tablet (20 mg total) by mouth daily. 30 tablet 3  . insulin lispro (HUMALOG) 100 UNIT/ML injection Inject 0.18  mLs (18 Units total) into the skin 3 (three) times daily before meals. 10 mL 1  . lisinopril (PRINIVIL,ZESTRIL) 10 MG tablet Take 1 tablet (10 mg total) by mouth daily. 30 tablet 6  . nitroGLYCERIN (NITROSTAT) 0.4 MG SL tablet Place 1 tablet (0.4 mg total) under the tongue every 5 (five) minutes as needed for chest pain (up to 3 doses). 25 tablet 3  . potassium chloride SA (K-DUR,KLOR-CON) 20 MEQ tablet Take 1 tablet (20 mEq total) by mouth daily. 30 tablet 3  . prasugrel (EFFIENT) 10 MG TABS tablet Take 1 tablet (10 mg total) by mouth daily. 30 tablet 11   No current facility-administered medications for this visit.     Allergies:   Naprosyn [naproxen]    Social History:  The patient  reports that he has  quit smoking. His smoking use included Cigarettes. He smoked 0.50 packs per day. He has never used smokeless tobacco. He reports that he does not drink alcohol or use drugs.   Family History:  The patient's family history includes Hypertension in his mother.    ROS:  Please see the history of present illness.  Otherwise, review of systems are positive for none.   All other systems are reviewed and negative.    PHYSICAL EXAM: VS:  BP 123/73   Pulse 61   Ht 6' (1.829 m)   Wt 295 lb (133.8 kg)   BMI 40.01 kg/m  , BMI Body mass index is 40.01 kg/m. GENERAL:  Well appearing HEENT:  Pupils equal round and reactive, fundi not visualized, oral mucosa unremarkable NECK:  No jugular venous distention, waveform within normal limits, carotid upstroke brisk and symmetric, no bruits, no thyromegaly LYMPHATICS:  No cervical adenopathy LUNGS:  Clear to auscultation bilaterally HEART:  RRR.  PMI not displaced or sustained,S1 and S2 within normal limits, no S3, no S4, no clicks, no rubs, no murmurs ABD:  Flat, positive bowel sounds normal in frequency in pitch, no bruits, no rebound, no guarding, no midline pulsatile mass, no hepatomegaly, no splenomegaly EXT:  2 plus pulses throughout, no edema, no cyanosis no clubbing SKIN:  No rashes no nodules NEURO:  Cranial nerves II through XII grossly intact, motor grossly intact throughout PSYCH:  Cognitively intact, oriented to person place and time    EKG:  EKG is ordered today. The ekg ordered today demonstrates sinus bradycardia rate 54 bpm.  Prior inferior infarct.  Echo 11/02/15: Study Conclusions  - Left ventricle: The cavity size was normal. Systolic function was   mildly to moderately reduced. The estimated ejection fraction was   in the range of 40% to 45%. Moderate hypokinesis of the inferior,   inferoseptal, and apical myocardium; consistent with ischemia in   the distribution of the right coronary artery. The tissue Doppler   parameters  were mildly abnormal for his young age. Left   ventricular diastolic function parameters were normal. Acoustic   contrast opacification revealed no evidence ofthrombus.  LHC 11/02/15:  RPDA lesion, 100% stenosed.  Mid RCA to Dist RCA lesion, 100% stenosed. Post intervention, there is a 0% residual stenosis.  There is mild to moderate left ventricular systolic dysfunction.   1. Severe one-vessel coronary artery disease with thrombotic occlusion of the mid to distal right coronary artery with evidence of embolization into the distal right PDA. No other obstructive disease. 2. Mild to moderate reduction of LV systolic function with an ejection fraction of 35-45% with distal inferior wall hypokinesis. 3. Successful balloon angioplasty and  drug-eluting stent placement to the mid to distal right coronary artery. A second overlapping stent was placed more proximally due to proximal edge dissection.  Recent Labs: 11/02/2015: ALT 34 11/14/2015: BUN 11; Creat 0.89; Hemoglobin 13.0; Platelets 334; Potassium 4.0; Sodium 135    Lipid Panel    Component Value Date/Time   CHOL 298 (H) 11/02/2015 0420   TRIG 181 (H) 11/02/2015 0420   HDL 36 (L) 11/02/2015 0420   CHOLHDL 8.3 11/02/2015 0420   VLDL 36 11/02/2015 0420   LDLCALC 226 (H) 11/02/2015 0420      Wt Readings from Last 3 Encounters:  01/02/16 295 lb (133.8 kg)  11/14/15 299 lb (135.6 kg)  11/02/15 296 lb 15.4 oz (134.7 kg)      ASSESSMENT AND PLAN:  # CAD s/p RCA STEMI: Mr. Wisham is doing very well post-MI.  He has made a commitment to lifestyle changes.  He is not having any chest pain or shortness of breath.  I encouraged him to resume exercising.  Continue aspirin and Prasugrel.  Can stop Prasugrel 11/2016.  Continue aspirin, carvedilol and atorvastatin.  He has an occluded PDA and was started on Imdur.  However, he currently denies angina and he has noted some positional lightheadedness.  We will stop Imdur.  # Hypertension: Blood  pressure well-controlled.  Continue metoprolol and lisinopril.    # Hyperlipidemia: Atorvastatin was started 10/2015.  We will check lipids and a CMP today.   Current medicines are reviewed at length with the patient today.  The patient does not have concerns regarding medicines.  The following changes have been made:  Stop Imdur  Labs/ tests ordered today include:   Orders Placed This Encounter  Procedures  . EKG 12-Lead     Disposition:   FU with Thelonious Kauffmann C. Duke Salvia, MD, [facility] in 6 months.    This note was written with the assistance of speech recognition software.  Please excuse any transcriptional errors.  Signed, Annaleigh Steinmeyer C. Duke Salvia, MD, [facility]  01/02/2016 2:09 PM    [facility]

## 2016-01-03 ENCOUNTER — Ambulatory Visit: Payer: Managed Care, Other (non HMO) | Admitting: Cardiovascular Disease

## 2016-02-24 ENCOUNTER — Other Ambulatory Visit: Payer: Self-pay | Admitting: Physician Assistant

## 2016-03-21 ENCOUNTER — Other Ambulatory Visit: Payer: Self-pay | Admitting: Physician Assistant

## 2016-03-25 ENCOUNTER — Other Ambulatory Visit: Payer: Self-pay

## 2016-03-25 ENCOUNTER — Encounter (HOSPITAL_COMMUNITY): Payer: Self-pay | Admitting: *Deleted

## 2016-05-22 ENCOUNTER — Other Ambulatory Visit: Payer: Self-pay | Admitting: Physician Assistant

## 2016-05-26 NOTE — Telephone Encounter (Signed)
Rx(s) sent to pharmacy electronically.  

## 2016-06-23 ENCOUNTER — Other Ambulatory Visit: Payer: Self-pay | Admitting: *Deleted

## 2016-06-23 MED ORDER — LISINOPRIL 10 MG PO TABS: 10.0000 mg | ORAL_TABLET | Freq: Every day | ORAL | 1 refills | Status: DC

## 2016-06-26 ENCOUNTER — Other Ambulatory Visit: Payer: Self-pay

## 2016-06-26 MED ORDER — ATORVASTATIN CALCIUM 80 MG PO TABS: 80.0000 mg | ORAL_TABLET | Freq: Every day | ORAL | 3 refills | Status: DC

## 2016-08-31 ENCOUNTER — Other Ambulatory Visit: Payer: Self-pay | Admitting: *Deleted

## 2016-08-31 MED ORDER — LISINOPRIL 10 MG PO TABS: 10.0000 mg | ORAL_TABLET | Freq: Every day | ORAL | 0 refills | Status: DC

## 2016-08-31 MED ORDER — CARVEDILOL 3.125 MG PO TABS: 3.1250 mg | ORAL_TABLET | Freq: Two times a day (BID) | ORAL | 0 refills | Status: DC

## 2016-09-03 ENCOUNTER — Encounter: Payer: Self-pay | Admitting: Cardiovascular Disease

## 2016-09-03 ENCOUNTER — Ambulatory Visit (INDEPENDENT_AMBULATORY_CARE_PROVIDER_SITE_OTHER): Payer: 59 | Admitting: Cardiovascular Disease

## 2016-09-03 VITALS — BP 121/73 | HR 71 | Ht 72.0 in | Wt 289.0 lb

## 2016-09-03 DIAGNOSIS — I5022 Chronic systolic (congestive) heart failure: Secondary | ICD-10-CM

## 2016-09-03 DIAGNOSIS — I110 Hypertensive heart disease with heart failure: Secondary | ICD-10-CM | POA: Diagnosis not present

## 2016-09-03 DIAGNOSIS — I10 Essential (primary) hypertension: Secondary | ICD-10-CM

## 2016-09-03 DIAGNOSIS — I255 Ischemic cardiomyopathy: Secondary | ICD-10-CM

## 2016-09-03 DIAGNOSIS — I5042 Chronic combined systolic (congestive) and diastolic (congestive) heart failure: Secondary | ICD-10-CM | POA: Diagnosis not present

## 2016-09-03 DIAGNOSIS — E785 Hyperlipidemia, unspecified: Secondary | ICD-10-CM

## 2016-09-03 NOTE — Progress Notes (Signed)
Cardiology Office Note   Date:  09/03/2016   ID:  Kirk Mckee, DOB [birth year], MRN [id number]  PCP:  Gaspar Garbe, MD  Cardiologist:   Chilton Si, MD   Chief Complaint  Patient presents with  . Follow-up    History of Present Illness: Kirk Mckee is a 36 y.o. male with diabetes mellitus type 2, hypertension, hyperlipidemia, CAD s/p STEMI, tobacco abuse, and chronic systolic and diastolic heart failure who presents for follow up.  Kirk Mckee was admitted 10/2015 with a STEMI and had a DES to the mid-distal RCA with overlapping stent due to proximal edge dissection.  There was also an occlusion of the PDA that was too small for PCI.  He was also diagnosed with diabetes during that hospitalization with glucose >400.  His hemoglobin A1c was 10.6%.  Echo revealed LVEF 40-45% with moderate hypokinesis of the inferior/inferoseptal/apical myocardium.  He followed up with Ronie Spies, PA-C, on 11/14/15 and was doing well.  Since then he continues to do well.  He is not getting regular exercise but walks approximately 2 miles twice per week.  He denies exertional chest pain or shortness of breath.  He also hasn't noted any lower extremity edema, orthopnea or PND.  He hasn't been checking his BP at home and admits to not taking atorvastatin regularly.  His lipids were recently checked with his PCP and he thinks that his LDL was 140.     Past Medical History:  Diagnosis Date  . Acute combined systolic and diastolic heart failure (HCC)    a. 10/2015: EF 35-40% by cath, 40-45% by echo.  . Bursitis of knee   . CAD (coronary artery disease)    a. 10/2015: STEMI s/p PTCA/DES to mid-distal RCA with overlapping stent due to proximal edge dissection, right PDA occlusion too small for PCI.  . Diabetes mellitus type 2 in obese (HCC)   . Hyperlipidemia   . Hyperlipidemia   . Hypertensive heart disease   . Ischemic cardiomyopathy   . Obesity   . Sinus bradycardia    a. HR 50s prevented further  med titration of BB.  . Tobacco abuse     Past Surgical History:  Procedure Laterality Date  . arm sugery    . CARDIAC CATHETERIZATION N/A 11/02/2015   Procedure: Left Heart Cath and Coronary Angiography;  Surgeon: Iran Ouch, MD;  Location: [facility];  Service: Cardiovascular;  Laterality: N/A;  . CARDIAC CATHETERIZATION N/A 11/02/2015   Procedure: Coronary Stent Intervention;  Surgeon: Iran Ouch, MD;  Location: [facility] CV [facility];  Service: Cardiovascular;  Laterality: N/A;     Current Outpatient Prescriptions  Medication Sig Dispense Refill  . aspirin EC 81 MG EC tablet Take 1 tablet (81 mg total) by mouth daily. 30 tablet 11  . atorvastatin (LIPITOR) 80 MG tablet Take 1 tablet (80 mg total) by mouth daily at 6 PM. 30 tablet 3  . carvedilol (COREG) 3.125 MG tablet Take 1 tablet (3.125 mg total) by mouth 2 (two) times daily with a meal. 180 tablet 0  . furosemide (LASIX) 20 MG tablet Take 1 tablet (20 mg total) by mouth daily. 30 tablet 10  . lisinopril (PRINIVIL,ZESTRIL) 10 MG tablet Take 1 tablet (10 mg total) by mouth daily. 90 tablet 0  . nitroGLYCERIN (NITROSTAT) 0.4 MG SL tablet Place 1 tablet (0.4 mg total) under the tongue every 5 (five) minutes as needed for chest pain (up to 3 doses). 25 tablet 3  .  potassium chloride SA (K-DUR,KLOR-CON) 20 MEQ tablet Take 1 tablet (20 mEq total) by mouth daily. 30 tablet 10  . prasugrel (EFFIENT) 10 MG TABS tablet Take 1 tablet (10 mg total) by mouth daily. 30 tablet 11   No current facility-administered medications for this visit.     Allergies:   Naprosyn [naproxen]    Social History:  The patient  reports that he has quit smoking. His smoking use included Cigarettes. He smoked 0.50 packs per day. He has never used smokeless tobacco. He reports that he does not drink alcohol or use drugs.   Family History:  The patient's family history includes Hypertension in his mother.    ROS:  Please see the history of present  illness.  Otherwise, review of systems are positive for none.   All other systems are reviewed and negative.    PHYSICAL EXAM: VS:  BP 121/73   Pulse 71   Ht 6' (1.829 m)   Wt 131.1 kg (289 lb)   BMI 39.20 kg/m  , BMI Body mass index is 39.2 kg/m. GENERAL:  Well appearing HEENT:  Pupils equal round and reactive, fundi not visualized, oral mucosa unremarkable NECK:  No jugular venous distention, waveform within normal limits, carotid upstroke brisk and symmetric, no bruits LUNGS:  Clear to auscultation bilaterally.  No crackles, wheezes or rhonchi HEART:  RRR.  PMI not displaced or sustained,S1 and S2 within normal limits, no S3, no S4, no clicks, no rubs, no murmurs ABD:  Flat, positive bowel sounds normal in frequency in pitch, no bruits, no rebound, no guarding, no midline pulsatile mass, no hepatomegaly, no splenomegaly EXT:  2 plus pulses throughout, no edema, no cyanosis no clubbing SKIN:  No rashes no nodules NEURO:  Cranial nerves II through XII grossly intact, motor grossly intact throughout PSYCH:  Cognitively intact, oriented to person place and time   EKG:  EKG is ordered today. The ekg ordered 01/02/16 demonstrates sinus bradycardia rate 54 bpm.  Prior inferior infarct. 09/03/17: Sinus rhythm. Rate 71 bpm. Prior inferior infarct.  Echo 11/02/15: Study Conclusions  - Left ventricle: The cavity size was normal. Systolic function was   mildly to moderately reduced. The estimated ejection fraction was   in the range of 40% to 45%. Moderate hypokinesis of the inferior,   inferoseptal, and apical myocardium; consistent with ischemia in   the distribution of the right coronary artery. The tissue Doppler   parameters were mildly abnormal for his young age. Left   ventricular diastolic function parameters were normal. Acoustic   contrast opacification revealed no evidence ofthrombus.  LHC 11/02/15:  RPDA lesion, 100% stenosed.  Mid RCA to Dist RCA lesion, 100% stenosed. Post  intervention, there is a 0% residual stenosis.  There is mild to moderate left ventricular systolic dysfunction.   1. Severe one-vessel coronary artery disease with thrombotic occlusion of the mid to distal right coronary artery with evidence of embolization into the distal right PDA. No other obstructive disease. 2. Mild to moderate reduction of LV systolic function with an ejection fraction of 35-45% with distal inferior wall hypokinesis. 3. Successful balloon angioplasty and drug-eluting stent placement to the mid to distal right coronary artery. A second overlapping stent was placed more proximally due to proximal edge dissection.  Recent Labs: 11/02/2015: ALT 34 11/14/2015: BUN 11; Creat 0.89; Hemoglobin 13.0; Platelets 334; Potassium 4.0; Sodium 135    Lipid Panel    Component Value Date/Time   CHOL 298 (H) 11/02/2015 [id number]  TRIG 181 (H) 11/02/2015 0420   HDL 36 (L) 11/02/2015 0420   CHOLHDL 8.3 11/02/2015 0420   VLDL 36 11/02/2015 0420   LDLCALC 226 (H) 11/02/2015 0420      Wt Readings from Last 3 Encounters:  09/03/16 131.1 kg (289 lb)  01/02/16 133.8 kg (295 lb)  11/14/15 135.6 kg (299 lb)      ASSESSMENT AND PLAN:  # CAD s/p RCA STEMI:  # Hyperlipidemia: Kirk Mckee is doing very well post-MI.  We discussed the importance of taking his medication as recommended. He will start taking atorvastatin 80 mg daily. We will repeat lipids and a CMP in 6 weeks. If his LDL remains greater than 70 we will add a PCSK-9 inhibitor. Continue aspirin, carvedilol, atorvastatin, and prasugrel.  OK to stop prasugrel 10/2016.  # Hypertension: Blood pressure well-controlled.  Continue carvedilol and lisinopril.    # Chronic systolic and diastolic heart failure: No heart failure on exam and no symptoms. Continue carvedilol and lisinopril.   Current medicines are reviewed at length with the patient today.  The patient does not have concerns regarding medicines.  The following changes have  been made:  none  Labs/ tests ordered today include:   Orders Placed This Encounter  Procedures  . Lipid panel  . Comprehensive metabolic panel  . EKG 12-Lead     Disposition:   FU with Kirk Mckee C. Duke Salvia, MD, [facility] in 6 months.    This note was written with the assistance of speech recognition software.  Please excuse any transcriptional errors.  Signed, Kona Lover C. Duke Salvia, MD, [facility]  09/03/2016 4:50 PM    [facility]

## 2016-09-03 NOTE — Patient Instructions (Addendum)
Medication Instructions:  STOP EFFIENT ON 11/02/16  START TAKING THE ATORVASTATIN DAILY   Labwork: FASTING LP/CMET IN 6 WEEKS CALL THE OFFICE FOR TIME   Testing/Procedures: NONE  Follow-Up: Your physician wants you to follow-up in: 6 MONTH OV You will receive a reminder letter in the mail two months in advance. If you don't receive a letter, please call our office to schedule the follow-up appointment.  If you need a refill on your cardiac medications before your next appointment, please call your pharmacy.

## 2016-11-06 ENCOUNTER — Other Ambulatory Visit: Payer: Self-pay | Admitting: Cardiovascular Disease

## 2016-11-06 NOTE — Telephone Encounter (Signed)
Please review for refill, Thanks !  

## 2016-12-09 ENCOUNTER — Other Ambulatory Visit: Payer: Self-pay | Admitting: *Deleted

## 2016-12-09 ENCOUNTER — Emergency Department (HOSPITAL_BASED_OUTPATIENT_CLINIC_OR_DEPARTMENT_OTHER): Admission: EM | Admit: 2016-12-09 | Discharge: 2016-12-09 | Discharge status: Absent without leave | Payer: 59

## 2016-12-09 MED ORDER — CARVEDILOL 3.125 MG PO TABS: 3.1250 mg | ORAL_TABLET | Freq: Two times a day (BID) | ORAL | 0 refills | Status: DC

## 2017-01-20 ENCOUNTER — Other Ambulatory Visit: Payer: Self-pay | Admitting: *Deleted

## 2017-01-20 MED ORDER — LISINOPRIL 10 MG PO TABS: 10.0000 mg | ORAL_TABLET | Freq: Every day | ORAL | 0 refills | Status: DC

## 2017-02-19 ENCOUNTER — Other Ambulatory Visit: Payer: Self-pay

## 2017-02-19 MED ORDER — POTASSIUM CHLORIDE CRYS ER 20 MEQ PO TBCR: 20.0000 meq | EXTENDED_RELEASE_TABLET | Freq: Every day | ORAL | 10 refills | Status: DC

## 2017-05-26 ENCOUNTER — Other Ambulatory Visit: Payer: Self-pay

## 2017-05-26 MED ORDER — CARVEDILOL 3.125 MG PO TABS: 3.1250 mg | ORAL_TABLET | Freq: Two times a day (BID) | ORAL | 1 refills | Status: DC

## 2017-05-26 MED ORDER — CARVEDILOL 3.125 MG PO TABS: 3.1250 mg | ORAL_TABLET | Freq: Two times a day (BID) | ORAL | 2 refills | Status: DC

## 2017-10-21 ENCOUNTER — Other Ambulatory Visit: Payer: Self-pay | Admitting: *Deleted

## 2017-10-21 MED ORDER — LISINOPRIL 10 MG PO TABS: 10.0000 mg | ORAL_TABLET | Freq: Every day | ORAL | 0 refills | Status: DC

## 2017-11-27 ENCOUNTER — Other Ambulatory Visit: Payer: Self-pay | Admitting: Cardiovascular Disease

## 2018-01-12 ENCOUNTER — Other Ambulatory Visit: Payer: Self-pay | Admitting: Cardiovascular Disease

## 2018-03-11 ENCOUNTER — Other Ambulatory Visit: Payer: Self-pay | Admitting: Cardiovascular Disease

## 2018-04-05 ENCOUNTER — Ambulatory Visit (INDEPENDENT_AMBULATORY_CARE_PROVIDER_SITE_OTHER): Payer: PRIVATE HEALTH INSURANCE | Admitting: Cardiology

## 2018-04-05 ENCOUNTER — Encounter: Payer: Self-pay | Admitting: Cardiology

## 2018-04-05 ENCOUNTER — Telehealth (HOSPITAL_COMMUNITY): Payer: Self-pay

## 2018-04-05 VITALS — BP 126/74 | HR 84 | Ht 72.0 in | Wt 294.0 lb

## 2018-04-05 DIAGNOSIS — I10 Essential (primary) hypertension: Secondary | ICD-10-CM

## 2018-04-05 DIAGNOSIS — I255 Ischemic cardiomyopathy: Secondary | ICD-10-CM | POA: Diagnosis not present

## 2018-04-05 DIAGNOSIS — I252 Old myocardial infarction: Secondary | ICD-10-CM

## 2018-04-05 DIAGNOSIS — R001 Bradycardia, unspecified: Secondary | ICD-10-CM | POA: Diagnosis not present

## 2018-04-05 DIAGNOSIS — E785 Hyperlipidemia, unspecified: Secondary | ICD-10-CM

## 2018-04-05 DIAGNOSIS — Z0289 Encounter for other administrative examinations: Secondary | ICD-10-CM

## 2018-04-05 DIAGNOSIS — Z9861 Coronary angioplasty status: Secondary | ICD-10-CM

## 2018-04-05 DIAGNOSIS — I2510 Atherosclerotic heart disease of native coronary artery without angina pectoris: Secondary | ICD-10-CM | POA: Diagnosis not present

## 2018-04-05 DIAGNOSIS — E669 Obesity, unspecified: Secondary | ICD-10-CM

## 2018-04-05 DIAGNOSIS — E119 Type 2 diabetes mellitus without complications: Secondary | ICD-10-CM

## 2018-04-05 MED ORDER — POTASSIUM CHLORIDE CRYS ER 20 MEQ PO TBCR: 20.0000 meq | EXTENDED_RELEASE_TABLET | Freq: Every day | ORAL | 11 refills | Status: DC

## 2018-04-05 MED ORDER — LISINOPRIL 5 MG PO TABS: 5.0000 mg | ORAL_TABLET | Freq: Every day | ORAL | 10 refills | Status: DC

## 2018-04-05 NOTE — Telephone Encounter (Signed)
Encounter complete. 

## 2018-04-05 NOTE — Assessment & Plan Note (Signed)
RCA PCI with DES July 2017 in setting of STEMI Residual small PDA occlusion

## 2018-04-05 NOTE — Assessment & Plan Note (Signed)
Followed by PCP

## 2018-04-05 NOTE — Assessment & Plan Note (Signed)
Inferior STEMI July 2017 

## 2018-04-05 NOTE — Assessment & Plan Note (Signed)
Controlled- he is taking his Lisinopril QOD

## 2018-04-05 NOTE — Patient Instructions (Addendum)
Medication Instructions:  TAKE 5mg  of Lisinopril every day  If you need a refill on your cardiac medications before your next appointment, please call your pharmacy.   Lab work: None  If you have labs (blood work) drawn today and your tests are completely normal, you will receive your results only by: Marland Kitchen. MyChart Message (if you have MyChart) OR . A paper copy in the mail If you have any lab test that is abnormal or we need to change your treatment, we will call you to review the results.  Testing/Procedures: Your physician has requested that you have an exercise tolerance test. For further information please visit [email or web address]. Please also follow instruction sheet, as given. HOLD YOUR COREG THE NIGHT BEFORE AND MORNING OF STRESS TEST.  Your physician has recommended that you have a sleep study. This test records several body functions during sleep, including: brain activity, eye movement, oxygen and carbon dioxide blood levels, heart rate and rhythm, breathing rate and rhythm, the flow of air through your mouth and nose, snoring, body muscle movements, and chest and belly movement.  Your physician has requested that you have an echocardiogram. Echocardiography is a painless test that uses sound waves to create images of your heart. It provides your doctor with information about the size and shape of your heart and how well your heart's chambers and valves are working. This procedure takes approximately one hour. There are no restrictions for this procedure. [address]  Follow-Up: At [facility], you and your health needs are our priority.  As part of our continuing mission to provide you with exceptional heart care, we have created designated Provider Care Teams.  These Care Teams include your primary Cardiologist (physician) and Advanced Practice Providers (APPs -  Physician Assistants and Nurse Practitioners) who all work together to provide you with the care you  need, when you need it. You will need a follow up appointment in 6 months.  Please call our office 2 months in advance to schedule this appointment.  You may see Dr. Chilton Siiffany Lemont or one of the following Advanced Practice Providers on your designated Care Team:   Corine ShelterLuke Kilroy, PA-C Judy PimpleKrista Kroeger, [place] JerseyPA-C . Marjie Skiffallie Goodrich, PA-C  Any Other Special Instructions Will Be Listed Below (If Applicable). INCREASE EXERCISE TO 30 MINUTES TO AT LEAST 30 MINUTES A DAY

## 2018-04-05 NOTE — Assessment & Plan Note (Signed)
BMI 39 

## 2018-04-05 NOTE — Assessment & Plan Note (Signed)
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

## 2018-04-05 NOTE — Assessment & Plan Note (Signed)
Tolerating low dose beta blocker

## 2018-04-05 NOTE — Progress Notes (Signed)
04/05/2018 Kirk Mckee   August 21, 1980  [id number]  Primary Physician Tisovec, Adelfa Kohichard W, MD Primary Cardiologist: Dr Kirk Mckee  HPI: Mr. Kirk Mckee is a pleasant 37 year old married obese African-American male with a history of coronary disease and STE MI.  Patient presented in July 2017 with an inferior STEMI.  At cath he had an RCA occlusion treated with a DES.  He had residual PDA disease that was small and the plan is for medical therapy.  His ejection fraction at that time was 40 to 45% by echocardiogram.  Other problems include essential hypertension, dyslipidemia, non-insulin-dependent diabetes, and obesity.  He is in the office today for routine follow-up and he needs a stress test, echo, and sleep study scheduled for his DOT physical.  Since we saw him last he is been doing well.  He denies chest pain or unusual dyspnea.  His LDL in May 2019 was 150.  This is being followed by his primary care provider.  He admits he was not taking his statin regularly at that time and he is now.  He says his diabetes is under pretty good control.  He thinks he may snore.  He walks his dog twice a day although only half to a mile a day.  He is not taking his lisinopril on a regular basis, he takes 10 mg every other day.    Current Outpatient Medications  Medication Sig Dispense Refill  . aspirin EC 81 MG EC tablet Take 1 tablet (81 mg total) by mouth daily. 30 tablet 11  . atorvastatin (LIPITOR) 80 MG tablet Take 1 tablet (80 mg total) by mouth daily at 6 PM. 30 tablet 3  . Canagliflozin-metFORMIN HCl ER (INVOKAMET XR) [phone] MG TB24 Take 1 tablet by mouth daily.    . carvedilol (COREG) 3.125 MG tablet Take 1 tablet (3.125 mg total) by mouth 2 (two) times daily with a meal. 180 tablet 2  . colchicine 0.6 MG tablet Take 1 tablet by mouth as directed.  5  . furosemide (LASIX) 20 MG tablet Take 1 tablet (20 mg total) by mouth daily. 30 tablet 10  . lisinopril (PRINIVIL,ZESTRIL) 10 MG tablet Take 1 tablet (10  mg total) by mouth daily. NEED OV. 30 tablet 0  . nitroGLYCERIN (NITROSTAT) 0.4 MG SL tablet Place 1 tablet (0.4 mg total) under the tongue every 5 (five) minutes as needed for chest pain (up to 3 doses). 25 tablet 3  . potassium chloride SA (K-DUR,KLOR-CON) 20 MEQ tablet Take 1 tablet (20 mEq total) by mouth daily. 30 tablet 10  . prasugrel (EFFIENT) 10 MG TABS tablet Take 1 tablet (10 mg total) by mouth daily. 30 tablet 11   No current facility-administered medications for this visit.     Allergies  Allergen Reactions  . Naprosyn [Naproxen]     Past Medical History:  Diagnosis Date  . Acute combined systolic and diastolic heart failure (HCC)    a. 10/2015: EF 35-40% by cath, 40-45% by echo.  . Bursitis of knee   . CAD (coronary artery disease)    a. 10/2015: STEMI s/p PTCA/DES to mid-distal RCA with overlapping stent due to proximal edge dissection, right PDA occlusion too small for PCI.  . Diabetes mellitus type 2 in obese (HCC)   . Hyperlipidemia   . Hyperlipidemia   . Hypertensive heart disease   . Ischemic cardiomyopathy   . Obesity   . Sinus bradycardia    a. HR 50s prevented further med titration of BB.  .Marland Kitchen  Tobacco abuse     Social History   Socioeconomic History  . Marital status: Single    Spouse name: Not on file  . Number of children: Not on file  . Years of education: Not on file  . Highest education level: Not on file  Occupational History  . Not on file  Social Needs  . Financial resource strain: Not on file  . Food insecurity:    Worry: Not on file    Inability: Not on file  . Transportation needs:    Medical: Not on file    Non-medical: Not on file  Tobacco Use  . Smoking status: Former Smoker    Packs/day: 0.50    Types: Cigarettes  . Smokeless tobacco: Never Used  Substance and Sexual Activity  . Alcohol use: No    Alcohol/week: 0.0 standard drinks  . Drug use: No  . Sexual activity: Yes  Lifestyle  . Physical activity:    Days per week:  Not on file    Minutes per session: Not on file  . Stress: Not on file  Relationships  . Social connections:    Talks on phone: Not on file    Gets together: Not on file    Attends religious service: Not on file    Active member of club or organization: Not on file    Attends meetings of clubs or organizations: Not on file    Relationship status: Not on file  . Intimate partner violence:    Fear of current or ex partner: Not on file    Emotionally abused: Not on file    Physically abused: Not on file    Forced sexual activity: Not on file  Other Topics Concern  . Not on file  Social History Narrative  . Not on file     Family History  Problem Relation Age of Onset  . Hypertension Mother      Review of Systems: General: negative for chills, fever, night sweats or weight changes.  Cardiovascular: negative for chest pain, dyspnea on exertion, edema, orthopnea, palpitations, paroxysmal nocturnal dyspnea or shortness of breath Dermatological: negative for rash Respiratory: negative for cough or wheezing Urologic: negative for hematuria Abdominal: negative for nausea, vomiting, diarrhea, bright red blood per rectum, melena, or hematemesis Neurologic: negative for visual changes, syncope, or dizziness All other systems reviewed and are otherwise negative except as noted above.    Blood pressure 126/74, pulse 84, height 6' (1.829 m), weight 294 lb (133.4 kg), SpO2 97 %.  General appearance: alert, cooperative, no distress and moderately obese Neck: no carotid bruit and no JVD Lungs: clear to auscultation bilaterally Heart: regular rate and rhythm Extremities: no edema Skin: Skin color, texture, turgor normal. 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  I have asked him to try and take his Lisinopril-5 mg daily (he takes 20 mg QOD now). I encouraged him to try and increase his walking, his weight is up from LOV.  Dr Cleopatra Cedar is following his lipids. He asked about coming off some of his medications, I'll ask Dr Kirk Salvia about stopping Effient.   Corine Shelter PA-C 04/05/2018 8:40 AM

## 2018-04-05 NOTE — Assessment & Plan Note (Signed)
EF 40-45% by echo July 2019

## 2018-04-06 ENCOUNTER — Ambulatory Visit (HOSPITAL_COMMUNITY)
Admission: RE | Admit: 2018-04-06 | Discharge: 2018-04-06 | Disposition: A | Discharge status: Home / Self Care | Payer: PRIVATE HEALTH INSURANCE | Source: Ambulatory Visit | Attending: Cardiovascular Disease | Admitting: Cardiovascular Disease

## 2018-04-06 ENCOUNTER — Ambulatory Visit (HOSPITAL_BASED_OUTPATIENT_CLINIC_OR_DEPARTMENT_OTHER): Payer: PRIVATE HEALTH INSURANCE

## 2018-04-06 DIAGNOSIS — R001 Bradycardia, unspecified: Secondary | ICD-10-CM

## 2018-04-06 DIAGNOSIS — Z9861 Coronary angioplasty status: Secondary | ICD-10-CM | POA: Insufficient documentation

## 2018-04-06 DIAGNOSIS — I10 Essential (primary) hypertension: Secondary | ICD-10-CM | POA: Insufficient documentation

## 2018-04-06 DIAGNOSIS — E119 Type 2 diabetes mellitus without complications: Secondary | ICD-10-CM

## 2018-04-06 DIAGNOSIS — Z0289 Encounter for other administrative examinations: Secondary | ICD-10-CM

## 2018-04-06 DIAGNOSIS — E785 Hyperlipidemia, unspecified: Secondary | ICD-10-CM | POA: Diagnosis present

## 2018-04-06 DIAGNOSIS — E669 Obesity, unspecified: Secondary | ICD-10-CM | POA: Insufficient documentation

## 2018-04-06 DIAGNOSIS — I252 Old myocardial infarction: Secondary | ICD-10-CM

## 2018-04-06 DIAGNOSIS — I255 Ischemic cardiomyopathy: Secondary | ICD-10-CM | POA: Insufficient documentation

## 2018-04-06 DIAGNOSIS — I2510 Atherosclerotic heart disease of native coronary artery without angina pectoris: Secondary | ICD-10-CM

## 2018-04-06 LAB — EXERCISE TOLERANCE TEST
Estimated workload: 10.1 METS
Exercise duration (min): 9 min
Exercise duration (sec): 3 s
MPHR: 181 {beats}/min
Peak HR: 166 {beats}/min
Percent HR: 91 %
RPE: 20
Rest HR: 76 {beats}/min

## 2018-04-15 ENCOUNTER — Ambulatory Visit (HOSPITAL_BASED_OUTPATIENT_CLINIC_OR_DEPARTMENT_OTHER): Payer: PRIVATE HEALTH INSURANCE | Attending: Cardiology | Admitting: Cardiovascular Disease

## 2018-04-15 VITALS — Ht 72.0 in | Wt 295.0 lb

## 2018-04-15 DIAGNOSIS — Z6841 Body Mass Index (BMI) 40.0 and over, adult: Secondary | ICD-10-CM | POA: Insufficient documentation

## 2018-04-15 DIAGNOSIS — Z9861 Coronary angioplasty status: Secondary | ICD-10-CM

## 2018-04-15 DIAGNOSIS — E669 Obesity, unspecified: Secondary | ICD-10-CM | POA: Insufficient documentation

## 2018-04-15 DIAGNOSIS — I10 Essential (primary) hypertension: Secondary | ICD-10-CM | POA: Diagnosis not present

## 2018-04-15 DIAGNOSIS — I252 Old myocardial infarction: Secondary | ICD-10-CM | POA: Diagnosis not present

## 2018-04-15 DIAGNOSIS — I255 Ischemic cardiomyopathy: Secondary | ICD-10-CM | POA: Insufficient documentation

## 2018-04-15 DIAGNOSIS — Z0289 Encounter for other administrative examinations: Secondary | ICD-10-CM | POA: Diagnosis not present

## 2018-04-15 DIAGNOSIS — R001 Bradycardia, unspecified: Secondary | ICD-10-CM | POA: Diagnosis not present

## 2018-04-15 DIAGNOSIS — G4761 Periodic limb movement disorder: Secondary | ICD-10-CM

## 2018-04-15 DIAGNOSIS — E119 Type 2 diabetes mellitus without complications: Secondary | ICD-10-CM | POA: Diagnosis not present

## 2018-04-15 DIAGNOSIS — I2510 Atherosclerotic heart disease of native coronary artery without angina pectoris: Secondary | ICD-10-CM

## 2018-04-15 DIAGNOSIS — E785 Hyperlipidemia, unspecified: Secondary | ICD-10-CM | POA: Insufficient documentation

## 2018-04-15 DIAGNOSIS — G478 Other sleep disorders: Secondary | ICD-10-CM

## 2018-05-01 ENCOUNTER — Encounter (HOSPITAL_BASED_OUTPATIENT_CLINIC_OR_DEPARTMENT_OTHER): Payer: Self-pay | Admitting: Cardiovascular Disease

## 2018-05-01 NOTE — Procedures (Signed)
Patient Name: Kirk Mckee, Kirk Mckee Study Date: 04/15/2018 Gender: Male D.O.B: 1980/07/08 Age (years): 37 Referring Provider: Corine ShelterLuke Kilroy Height (inches): 72 Interpreting Physician: Nicki Guadalajarahomas Kelly MD, ABSM Weight (lbs): 295 RPSGT: Lowry RamMckinney, [name] BMI: 40 MRN: [id number] Neck Size: 17.50  CLINICAL INFORMATION Sleep Study Type: NPSG  Indication for sleep study: Diabetes, Hypertension, Obesity, Snoring  Epworth Sleepiness Score: 3  SLEEP STUDY TECHNIQUE As per the AASM Manual for the Scoring of Sleep and Associated Events v2.3 (April 2016) with a hypopnea requiring 4% desaturations.  The channels recorded and monitored were frontal, central and occipital EEG, electrooculogram (EOG), submentalis EMG (chin), nasal and oral airflow, thoracic and abdominal wall motion, anterior tibialis EMG, snore microphone, electrocardiogram, and pulse oximetry.  MEDICATIONS     aspirin EC 81 MG EC tablet             atorvastatin (LIPITOR) 80 MG tablet         Canagliflozin-metFORMIN HCl ER (INVOKAMET XR) [phone] MG TB24         carvedilol (COREG) 3.125 MG tablet         colchicine 0.6 MG tablet         furosemide (LASIX) 20 MG tablet         lisinopril (PRINIVIL,ZESTRIL) 5 MG tablet         nitroGLYCERIN (NITROSTAT) 0.4 MG SL tablet         potassium chloride SA (K-DUR,KLOR-CON) 20 MEQ tablet         prasugrel (EFFIENT) 10 MG TABS tablet       Medications self-administered by patient taken the night of the study : ATORVASTATIN, METFORMIN HYDROCHLORIDE ER, CARVEDILOL  SLEEP ARCHITECTURE The study was initiated at 10:46:41 PM and ended at 5:15:15 AM.  Sleep onset time was 22.8 minutes and the sleep efficiency was 72.4%%. The total sleep time was 281.5 minutes.  Stage REM latency was 99.5 minutes.  The patient spent 4.6%% of the night in stage N1 sleep, 71.0%% in stage N2 sleep, 0.0%% in stage N3 and 24.3% in REM.  Alpha intrusion was absent.  Supine sleep was 11.37%.  RESPIRATORY  PARAMETERS The overall apnea/hypopnea index (AHI) was 2.3 per hour.  The respiratory disturbance index (RDI) was 2.6/h. There were 0 total apneas, including 0 obstructive, 0 central and 0 mixed apneas. There were 11 hypopneas and 1 RERAs.  The AHI during Stage REM sleep was 1.8 per hour.  AHI while supine was 3.8 per hour.  The mean oxygen saturation was 93.3%. The minimum SpO2 during sleep was 87.0%.  Soft snoring was noted during this study.  CARDIAC DATA The 2 lead EKG demonstrated sinus rhythm. The mean heart rate was 58.0 beats per minute. Other EKG findings include: None.  LEG MOVEMENT DATA The total PLMS were 16 with a resulting PLMS index of 16.0. Associated arousal with leg movement index was 6.0 .  IMPRESSIONS - Increased Upper Airway Resistance (UARS) without significant obstructive sleep apnea  (AHI 2.3/h; RDI 2.6/h). - No significant central sleep apnea occurred during this study (CAI = 0.0/h). - Mild oxygen desaturation to a nadir of  87%. - The patient snored with soft snoring volume. - No cardiac abnormalities were noted during this study. - Clinically significant periodic limb movements did not occur during sleep. Associated arousals were significant.  DIAGNOSIS - Increased Upper Airway Resistance (UARS)  - Periodic Limb Movement During Sleep (327.51 [G47.61 ICD-10]) - Nocturnal Hypoxemia (327.26 [G47.36 ICD-10])  RECOMMENDATIONS - At present there is no  indication for CPAP therapy. - Effort should be made to optimize nasal and oropharyngeal patency. - If patient is symptomatic with restless legs, consider a trial of pharmacotherapy for treatment of Periodic Leg Movements of Sleep. - Avoid alcohol, sedatives and other CNS depressants that may worsen sleep apnea and disrupt normal sleep architecture. - Sleep hygiene should be reviewed to assess factors that may improve sleep quality. - Weight management (BMI40) and regular exercise should be initiated or continued  if appropriate.  [Electronically signed] 05/01/2018 11:22 AM  Nicki Guadalajara MD, [facility], ABSM Diplomate, American Board of Sleep Medicine   NPI: [id number] [facility] PH: [phone]   FX: [phone] ACCREDITED BY THE AMERICAN ACADEMY OF SLEEP MEDICINE

## 2018-05-03 ENCOUNTER — Telehealth: Payer: Self-pay | Admitting: *Deleted

## 2018-05-03 NOTE — Telephone Encounter (Signed)
-----   Message from Lennette Bihari, MD sent at 05/01/2018 11:27 AM EST ----- Kirk Mckee, please notify pt of the results; no need for CPAP presently

## 2018-05-03 NOTE — Telephone Encounter (Signed)
Patient notified of sleep study results. Patient voiced understanding.

## 2018-06-01 ENCOUNTER — Telehealth: Payer: Self-pay

## 2018-06-01 ENCOUNTER — Telehealth: Payer: Self-pay | Admitting: Cardiovascular Disease

## 2018-06-01 NOTE — Telephone Encounter (Signed)
Patient came in today stating that

## 2018-06-01 NOTE — Telephone Encounter (Signed)
New Message    Patient is calling because [employer] is stating that they need a copy of his stress test and a letter stating that it is okay for him to return to work. Please call.

## 2018-06-01 NOTE — Telephone Encounter (Signed)
Informed pt a message will be sent to Dr. Leonides Sakeandolph's nurse.

## 2018-06-02 ENCOUNTER — Encounter: Payer: Self-pay | Admitting: *Deleted

## 2018-06-02 NOTE — Telephone Encounter (Signed)
Patient came by the office. Letter done and given to patient. Faxed to [phone] at the request of patient.

## 2018-06-03 ENCOUNTER — Ambulatory Visit: Payer: Self-pay | Admitting: Cardiovascular Disease

## 2018-06-17 ENCOUNTER — Other Ambulatory Visit: Payer: Self-pay | Admitting: Cardiovascular Disease

## 2018-06-17 NOTE — Telephone Encounter (Signed)
Rx has been sent to the pharmacy electronically. ° °

## 2019-01-12 ENCOUNTER — Other Ambulatory Visit: Payer: Self-pay

## 2019-01-12 ENCOUNTER — Telehealth: Payer: Self-pay | Admitting: Cardiovascular Disease

## 2019-01-12 MED ORDER — FUROSEMIDE 20 MG PO TABS: 20.0000 mg | ORAL_TABLET | Freq: Every day | ORAL | 2 refills | Status: DC

## 2019-01-12 MED ORDER — LISINOPRIL 5 MG PO TABS: 5.0000 mg | ORAL_TABLET | Freq: Every day | ORAL | 2 refills | Status: DC

## 2019-01-12 NOTE — Telephone Encounter (Signed)
New Message    *STAT* If patient is at the pharmacy, call can be transferred to refill team.   1. Which medications need to be refilled? (please list name of each medication and dose if known) Lasix 20mg  and Lisinopril 5mg   2. Which pharmacy/location (including street and city if local pharmacy) is medication to be sent to? [facility] on [address]  3. Do they need a 30 day or 90 day supply? 90 day supply

## 2019-04-07 ENCOUNTER — Other Ambulatory Visit: Payer: Self-pay | Admitting: Cardiology

## 2019-04-17 NOTE — Progress Notes (Signed)
[facility] - Clinic Note  04/19/2019     CHIEF COMPLAINT Patient presents for Diabetic Eye Exam   HISTORY OF PRESENT ILLNESS: Kirk Mckee is a 38 y.o. male who presents to the clinic today for:   HPI    Diabetic Eye Exam    Vision is stable.  Diabetes characteristics include Type 2, controlled with diet and taking oral medications.  This started 2 years ago.  Blood sugar level is controlled.  Last Blood Glucose 180 (1 wk ago).  Last A1C 6.5 (4 mos ago).  I, the attending physician,  performed the HPI with the patient and updated documentation appropriately.          Comments    38 y/o male pt referred by PCP, Dr. Wylene Simmerisovec for DEE.  Last saw Dr. Wylene Simmerisovec virtually about 1 mo ago.  LEE x 1 yr.  VA good both eyes with correction.  Wears glasses and cl.  Denies pain, flashes, floaters.  No gtts.  Feels RUL lags a bit.       Last edited by Rennis ChrisZamora, Skylarr Liz, MD on 04/20/2019  1:48 AM. (History)    pt is here on the referral of his PCP, Dr. Wylene Simmerisovec, pt states he is not having any problems with his vision, he states he wears glasses and is diabetic, he takes metformin and Jardiance, pt states he has a hx of heart attack, but does not take any heart meds currently  Referring physician: Gaspar Garbeisovec, Richard W, MD [address] [place] [id number]  HISTORICAL INFORMATION:   Selected notes from the MEDICAL RECORD NUMBER    CURRENT MEDICATIONS: No current outpatient medications on file. (Ophthalmic Drugs)   No current facility-administered medications for this visit. (Ophthalmic Drugs)   Current Outpatient Medications (Other)  Medication Sig  . aspirin EC 81 MG EC tablet Take 1 tablet (81 mg total) by mouth daily.  Marland Kitchen. atorvastatin (LIPITOR) 80 MG tablet Take 1 tablet (80 mg total) by mouth daily at 6 PM.  . carvedilol (COREG) 3.125 MG tablet TAKE 1 TABLET(3.125 MG) BY MOUTH TWICE DAILY WITH A MEAL  . colchicine 0.6 MG tablet Take 1 tablet by mouth as directed.  .  furosemide (LASIX) 20 MG tablet Take 1 tablet (20 mg total) by mouth daily.  Marland Kitchen. JARDIANCE 25 MG TABS tablet Take 25 mg by mouth daily.  Marland Kitchen. lisinopril (ZESTRIL) 10 MG tablet Take 5 mg by mouth daily.  . nitroGLYCERIN (NITROSTAT) 0.4 MG SL tablet Place 1 tablet (0.4 mg total) under the tongue every 5 (five) minutes as needed for chest pain (up to 3 doses).  . potassium chloride SA (KLOR-CON) 20 MEQ tablet TAKE 1 TABLET(20 MEQ) BY MOUTH DAILY  . Canagliflozin-metFORMIN HCl ER (INVOKAMET XR) [phone] MG TB24 Take 1 tablet by mouth daily.  Marland Kitchen. lisinopril (ZESTRIL) 5 MG tablet Take 1 tablet (5 mg total) by mouth daily. (Patient not taking: Reported on 04/19/2019)  . prasugrel (EFFIENT) 10 MG TABS tablet Take 1 tablet (10 mg total) by mouth daily.   No current facility-administered medications for this visit. (Other)      REVIEW OF SYSTEMS: ROS    Positive for: Endocrine, Cardiovascular, Eyes   Negative for: Constitutional, Gastrointestinal, Neurological, Skin, Genitourinary, Musculoskeletal, HENT, Respiratory, Psychiatric, Allergic/Imm, Heme/Lymph   Last edited by Celine MansBaxley, Andrew G, COA on 04/19/2019  2:46 PM. (History)       ALLERGIES Allergies  Allergen Reactions  . Naprosyn [Naproxen]     PAST MEDICAL  HISTORY Past Medical History:  Diagnosis Date  . Acute combined systolic and diastolic heart failure (HCC)    a. 10/2015: EF 35-40% by cath, 40-45% by echo.  . Bursitis of knee   . CAD (coronary artery disease)    a. 10/2015: STEMI s/p PTCA/DES to mid-distal RCA with overlapping stent due to proximal edge dissection, right PDA occlusion too small for PCI.  . Diabetes mellitus type 2 in obese (HCC)   . Hyperlipidemia   . Hyperlipidemia   . Hypertensive heart disease   . Ischemic cardiomyopathy   . Obesity   . Sinus bradycardia    a. HR 50s prevented further med titration of BB.  . Tobacco abuse    Past Surgical History:  Procedure Laterality Date  . arm sugery    . CARDIAC  CATHETERIZATION N/A 11/02/2015   Procedure: Left Heart Cath and Coronary Angiography;  Surgeon: Iran Ouch, MD;  Location: [facility];  Service: Cardiovascular;  Laterality: N/A;  . CARDIAC CATHETERIZATION N/A 11/02/2015   Procedure: Coronary Stent Intervention;  Surgeon: Iran Ouch, MD;  Location: [facility];  Service: Cardiovascular;  Laterality: N/A;    FAMILY HISTORY Family History  Problem Relation Age of Onset  . Hypertension Mother     SOCIAL HISTORY Social History   Tobacco Use  . Smoking status: Former Smoker    Packs/day: 0.50    Types: Cigarettes  . Smokeless tobacco: Never Used  Substance Use Topics  . Alcohol use: No    Alcohol/week: 0.0 standard drinks  . Drug use: No         OPHTHALMIC EXAM:  Base Eye Exam    Visual Acuity (Snellen - Linear)      Right Left   Dist cc 20/20 + 20/20   Correction: Glasses       Tonometry (Tonopen, 2:48 PM)      Right Left   Pressure 11 13       Pupils      Dark Light Shape React APD   Right 4 3 Round Brisk None   Left 4 3 Round Brisk None       Visual Fields (Counting fingers)      Left Right    Full Full       Extraocular Movement      Right Left    Full, Ortho Full, Ortho       Neuro/Psych    Oriented x3: Yes   Mood/Affect: Normal       Dilation    Both eyes: 1.0% Mydriacyl, 2.5% Phenylephrine @ 2:48 PM        Slit Lamp and Fundus Exam    Slit Lamp Exam      Right Left   Lids/Lashes Mild Meibomian gland dysfunction Mild Meibomian gland dysfunction   Conjunctiva/Sclera Mild Melanosis Mild Melanosis   Cornea Arcus, 3 focal corneal scars non-central, otherwise clear Arcus, scattered corneal scars greatest IT   Anterior Chamber Deep and quiet Deep and quiet   Iris Round and dilated, No NVI Round and dilated, No NVI   Lens 1-2+ Nuclear sclerosis, 1+ Cortical cataract 1-2+ Nuclear sclerosis, 1+ Cortical cataract   Vitreous Vitreous syneresis, prominent vitreous base greatest  inferiorly Vitreous syneresis, prominent vitreous base greatest inferiorly       Fundus Exam      Right Left   Disc +cupping, Pink and Sharp, mild temporal PPP +cupping, Pink and Sharp   C/D Ratio 0.7 0.7  Macula Flat, Good foveal reflex, No heme or edema Flat, Good foveal reflex, Retinal pigment epithelial mottling, No heme or edema   Vessels Vascular attenuation, Tortuous Vascular attenuation, Tortuous   Periphery Attached, peripheral cystoid degeneration inferiorly, No heme  Attached, peripheral cystoid degeneration inferiorly, No heme         Refraction    Wearing Rx      Sphere Cylinder Axis   Right -5.50 +0.75 090   Left -5.50 Sphere    Age: 55yr   Type: SVL       Manifest Refraction      Sphere Cylinder Axis Dist VA   Right -5.50 +0.75 090 20/20+   Left -5.75 Sphere  20/20+          IMAGING AND PROCEDURES  Imaging and Procedures for @  OCT, Retina - OU - Both Eyes       Right Eye Quality was good. Central Foveal Thickness: 283. Progression has no prior data. Findings include normal foveal contour, no IRF, no SRF, vitreomacular adhesion .   Left Eye Quality was good. Central Foveal Thickness: 303. Progression has no prior data. Findings include no IRF, normal foveal contour, no SRF.   Notes *Images captured and stored on drive  Diagnosis / Impression:  NFP, no IRF/SRF OU  No DME OU  Clinical management:  See below  Abbreviations: NFP - Normal foveal profile. CME - cystoid macular edema. PED - pigment epithelial detachment. IRF - intraretinal fluid. SRF - subretinal fluid. EZ - ellipsoid zone. ERM - epiretinal membrane. ORA - outer retinal atrophy. ORT - outer retinal tubulation. SRHM - subretinal hyper-reflective material                 ASSESSMENT/PLAN:    ICD-10-CM   1. Diabetes mellitus type 2 without retinopathy (HCC)  E11.9   2. Retinal edema  H35.81 OCT, Retina - OU - Both Eyes  3. Essential hypertension  I10   4. Hypertensive  retinopathy of both eyes  H35.033   5. Glaucoma suspect of both eyes  H40.003   6. Corneal scars, both eyes  H17.9     1,2. Diabetes mellitus, type 2 without retinopathy  - The incidence, risk factors for progression, natural history and treatment options for diabetic retinopathy  were discussed with patient.    - The need for close monitoring of blood glucose, blood pressure, and serum lipids, avoiding cigarette or any type of tobacco, and the need for long term follow up was also discussed with patient.  - f/u in 1 year, sooner prn  3,4. Hypertensive retinopathy OU - discussed importance of tight BP control - monitor  5. Glaucoma Suspect OU   - increased c/d ratio -- ?physiologic  - denies family history of glaucoma  - IOP 11,13  - will refer to Dr. Alben Spittle for glaucoma eval and establishment of primary ophthalmology care  6. 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      Return in about 1 year (around 04/18/2020) for f/u DM exam, DFE, OCT.  There are no Patient Instructions on file for this visit.   Explained the diagnoses, plan, and follow up with the patient and they expressed understanding.  Patient expressed understanding of the importance of proper follow up care.   This document serves  as a record of services personally performed by Gardiner Sleeper, MD, PhD. It was created on their behalf by Ernest Mallick, OA, an ophthalmic assistant. The creation of this record is the provider's dictation and/or activities during the visit.    Electronically signed by: Ernest Mallick, OA 12.23.2020 1:56 AM   Gardiner Sleeper, M.D., Ph.D. Diseases & Surgery of the Retina and Vitreous [facility]  I have reviewed the above documentation for accuracy and completeness, and I agree  with the above. Gardiner Sleeper, M.D., Ph.D. 04/20/19 1:56 AM    Abbreviations: M myopia (nearsighted); A astigmatism; H hyperopia (farsighted); P presbyopia; Mrx spectacle prescription;  CTL contact lenses; OD right eye; OS left eye; OU both eyes  XT exotropia; ET esotropia; PEK punctate epithelial keratitis; PEE punctate epithelial erosions; DES dry eye syndrome; MGD meibomian gland dysfunction; ATs artificial tears; PFAT's preservative free artificial tears; [place] nuclear sclerotic cataract; PSC posterior subcapsular cataract; ERM epi-retinal membrane; PVD posterior vitreous detachment; RD retinal detachment; DM diabetes mellitus; DR diabetic retinopathy; NPDR non-proliferative diabetic retinopathy; PDR proliferative diabetic retinopathy; CSME clinically significant macular edema; DME diabetic macular edema; dbh dot blot hemorrhages; CWS cotton wool spot; POAG primary open angle glaucoma; C/D cup-to-disc ratio; HVF humphrey visual field; GVF goldmann visual field; OCT optical coherence tomography; IOP intraocular pressure; BRVO Branch retinal vein occlusion; CRVO central retinal vein occlusion; CRAO central retinal artery occlusion; BRAO branch retinal artery occlusion; RT retinal tear; SB scleral buckle; PPV pars plana vitrectomy; VH Vitreous hemorrhage; PRP panretinal laser photocoagulation; IVK intravitreal kenalog; VMT vitreomacular traction; MH Macular hole;  NVD neovascularization of the disc; NVE neovascularization elsewhere; AREDS age related eye disease study; ARMD age related macular degeneration; POAG primary open angle glaucoma; EBMD epithelial/anterior basement membrane dystrophy; ACIOL anterior chamber intraocular lens; IOL intraocular lens; PCIOL posterior chamber intraocular lens; Phaco/IOL phacoemulsification with intraocular lens placement; [place] photorefractive keratectomy; LASIK laser assisted in situ keratomileusis; HTN hypertension; DM diabetes mellitus; COPD chronic obstructive pulmonary  disease

## 2019-04-19 ENCOUNTER — Ambulatory Visit (INDEPENDENT_AMBULATORY_CARE_PROVIDER_SITE_OTHER): Payer: PRIVATE HEALTH INSURANCE | Admitting: Ophthalmology

## 2019-04-19 ENCOUNTER — Encounter (INDEPENDENT_AMBULATORY_CARE_PROVIDER_SITE_OTHER): Payer: Self-pay | Admitting: Ophthalmology

## 2019-04-19 DIAGNOSIS — I10 Essential (primary) hypertension: Secondary | ICD-10-CM | POA: Diagnosis not present

## 2019-04-19 DIAGNOSIS — H35033 Hypertensive retinopathy, bilateral: Secondary | ICD-10-CM

## 2019-04-19 DIAGNOSIS — E119 Type 2 diabetes mellitus without complications: Secondary | ICD-10-CM | POA: Diagnosis not present

## 2019-04-19 DIAGNOSIS — H179 Unspecified corneal scar and opacity: Secondary | ICD-10-CM

## 2019-04-19 DIAGNOSIS — H3581 Retinal edema: Secondary | ICD-10-CM

## 2019-04-19 DIAGNOSIS — H40003 Preglaucoma, unspecified, bilateral: Secondary | ICD-10-CM

## 2019-04-20 ENCOUNTER — Encounter (INDEPENDENT_AMBULATORY_CARE_PROVIDER_SITE_OTHER): Payer: Self-pay | Admitting: Ophthalmology

## 2019-06-26 ENCOUNTER — Encounter (INDEPENDENT_AMBULATORY_CARE_PROVIDER_SITE_OTHER): Payer: Self-pay | Admitting: Ophthalmology

## 2019-07-04 ENCOUNTER — Other Ambulatory Visit: Payer: Self-pay | Admitting: Cardiovascular Disease

## 2019-07-05 ENCOUNTER — Telehealth (INDEPENDENT_AMBULATORY_CARE_PROVIDER_SITE_OTHER): Payer: BC Managed Care – PPO | Admitting: Cardiology

## 2019-07-05 ENCOUNTER — Encounter: Payer: Self-pay | Admitting: Cardiology

## 2019-07-05 VITALS — Ht 72.0 in | Wt 280.0 lb

## 2019-07-05 DIAGNOSIS — I252 Old myocardial infarction: Secondary | ICD-10-CM

## 2019-07-05 DIAGNOSIS — E785 Hyperlipidemia, unspecified: Secondary | ICD-10-CM

## 2019-07-05 DIAGNOSIS — I2510 Atherosclerotic heart disease of native coronary artery without angina pectoris: Secondary | ICD-10-CM

## 2019-07-05 DIAGNOSIS — Z9861 Coronary angioplasty status: Secondary | ICD-10-CM

## 2019-07-05 DIAGNOSIS — E119 Type 2 diabetes mellitus without complications: Secondary | ICD-10-CM

## 2019-07-05 DIAGNOSIS — I255 Ischemic cardiomyopathy: Secondary | ICD-10-CM

## 2019-07-05 DIAGNOSIS — I10 Essential (primary) hypertension: Secondary | ICD-10-CM

## 2019-07-05 MED ORDER — NITROGLYCERIN 0.4 MG SL SUBL: 0.4000 mg | SUBLINGUAL_TABLET | SUBLINGUAL | 3 refills | Status: DC | PRN

## 2019-07-05 NOTE — Patient Instructions (Signed)
Medication Instructions:  No changes *If you need a refill on your cardiac medications before your next appointment, please call your pharmacy*  Lab Work: None needed this visit  Testing/Procedures: None needed this visit  Follow-Up: At [facility], you and your health needs are our priority.  As part of our continuing mission to provide you with exceptional heart care, we have created designated Provider Care Teams.  These Care Teams include your primary Cardiologist (physician) and Advanced Practice Providers (APPs -  Physician Assistants and Nurse Practitioners) who all work together to provide you with the care you need, when you need it.  We recommend signing up for the patient portal called "MyChart".  Sign up information is provided on this After Visit Summary.  MyChart is used to connect with patients for Virtual Visits (Telemedicine).  Patients are able to view lab/test results, encounter notes, upcoming appointments, etc.  Non-urgent messages can be sent to your provider as well.   To learn more about what you can do with MyChart, go to [email or web address].    Your next appointment:   6-8 month(s)  The format for your next appointment:   In Person  Provider:   Chilton Si, MD

## 2019-07-05 NOTE — Progress Notes (Signed)
Virtual Visit via Telephone Note   This visit type was conducted due to national recommendations for restrictions regarding the COVID-19 Pandemic (e.g. social distancing) in an effort to limit this patient's exposure and mitigate transmission in our community.  Due to his co-morbid illnesses, this patient is at least at moderate risk for complications without adequate follow up.  This format is felt to be most appropriate for this patient at this time.  The patient did not have access to video technology/had technical difficulties with video requiring transitioning to audio format only (telephone).  All issues noted in this document were discussed and addressed.  No physical exam could be performed with this format.  Please refer to the patient's chart for his  consent to telehealth for [facility].   The patient was identified using 2 identifiers.  Date:  07/05/2019   ID:  Kirtland Bouchard, DOB [birth year], MRN [id number]  Patient Location: Home Provider Location: Home  PCP:  Tisovec, Adelfa Koh, MD  Cardiologist:  Dr Duke Salvia Electrophysiologist:  None   Evaluation Performed:  Follow-Up Visit  Chief Complaint:  none  History of Present Illness:    Kirk Mckee is a 39 y.o. male with a history of CAD and prior STEMI.  The patient presented in July 2017 with an inferior STEMI.  At cath he had an RCA occlusion treated with a DES.  He had residual PDA disease that was small and the plan is for medical therapy.  His ejection fraction at that time was 40 to 45% by echocardiogram.  Other problems include essential hypertension, dyslipidemia, non-insulin-dependent diabetes, and obesity. 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 GXT and echo were done then.  His GXT was negative except for HTN and his echo showed his EF had normalized.  He was contacted today for routine follow-up.  He does follow with his PCP regularly.  He says his blood pressure is  "fine".  He actually is not sure of all the medications he takes, he is on an aspirin and cholesterol therapy for sure.  He denies any chest pain.  He tells me as far as he can tell he never had a heart attack.  The patient does not have symptoms concerning for COVID-19 infection (fever, chills, cough, or new shortness of breath).    Past Medical History:  Diagnosis Date  . Acute combined systolic and diastolic heart failure (HCC)    a. 10/2015: EF 35-40% by cath, 40-45% by echo.  . Bursitis of knee   . CAD (coronary artery disease)    a. 10/2015: STEMI s/p PTCA/DES to mid-distal RCA with overlapping stent due to proximal edge dissection, right PDA occlusion too small for PCI.  . Diabetes mellitus type 2 in obese (HCC)   . Hyperlipidemia   . Hyperlipidemia   . Hypertensive heart disease   . Ischemic cardiomyopathy   . Obesity   . Sinus bradycardia    a. HR 50s prevented further med titration of BB.  . Tobacco abuse    Past Surgical History:  Procedure Laterality Date  . arm sugery    . CARDIAC CATHETERIZATION N/A 11/02/2015   Procedure: Left Heart Cath and Coronary Angiography;  Surgeon: Iran Ouch, MD;  Location: [facility];  Service: Cardiovascular;  Laterality: N/A;  . CARDIAC CATHETERIZATION N/A 11/02/2015   Procedure: Coronary Stent Intervention;  Surgeon: Iran Ouch, MD;  Location: [facility];  Service: Cardiovascular;  Laterality: N/A;     Current Meds  Medication Sig  . aspirin EC 81 MG EC tablet Take 1 tablet (81 mg total) by mouth daily.  Marland Kitchen atorvastatin (LIPITOR) 80 MG tablet Take 1 tablet (80 mg total) by mouth daily at 6 PM.  . carvedilol (COREG) 3.125 MG tablet TAKE 1 TABLET(3.125 MG) BY MOUTH TWICE DAILY WITH A MEAL  . colchicine 0.6 MG tablet Take 1 tablet by mouth as directed.  . furosemide (LASIX) 20 MG tablet Take 1 tablet (20 mg total) by mouth daily.  Marland Kitchen JARDIANCE 25 MG TABS tablet Take 25 mg by mouth daily.  Marland Kitchen lisinopril (ZESTRIL) 5 MG  tablet Take 1 tablet (5 mg total) by mouth daily.  . nitroGLYCERIN (NITROSTAT) 0.4 MG SL tablet Place 1 tablet (0.4 mg total) under the tongue every 5 (five) minutes as needed for chest pain (up to 3 doses).  . potassium chloride SA (KLOR-CON) 20 MEQ tablet TAKE 1 TABLET(20 MEQ) BY MOUTH DAILY  . prasugrel (EFFIENT) 10 MG TABS tablet Take 1 tablet (10 mg total) by mouth daily.  . [DISCONTINUED] lisinopril (ZESTRIL) 10 MG tablet Take 5 mg by mouth daily.  . [DISCONTINUED] nitroGLYCERIN (NITROSTAT) 0.4 MG SL tablet Place 1 tablet (0.4 mg total) under the tongue every 5 (five) minutes as needed for chest pain (up to 3 doses).     Allergies:   Naprosyn [naproxen]   Social History   Tobacco Use  . Smoking status: Former Smoker    Packs/day: 0.50    Types: Cigarettes  . Smokeless tobacco: Never Used  Substance Use Topics  . Alcohol use: No    Alcohol/week: 0.0 standard drinks  . Drug use: No     Family Hx: The patient's family history includes Hypertension in his mother.  ROS:   Please see the history of present illness.    All other systems reviewed and are negative.   Prior CV studies:   The following studies were reviewed today: GXT 2020/01/10Echo 05/06/18 Labs/Other Tests and Data Reviewed:    EKG:  An ECG dated 04/05/2018- NSR-HR 73, inferior Qs   Recent Labs: No results found for requested labs within last 8760 hours.   Recent Lipid Panel Lab Results  Component Value Date/Time   CHOL 298 (H) 11/02/2015 04:20 AM   TRIG 181 (H) 11/02/2015 04:20 AM   HDL 36 (L) 11/02/2015 04:20 AM   CHOLHDL 8.3 11/02/2015 04:20 AM   LDLCALC 226 (H) 11/02/2015 04:20 AM    Wt Readings from Last 3 Encounters:  07/05/19 280 lb (127 kg)  04/15/18 295 lb (133.8 kg)  04/05/18 294 lb (133.4 kg)     Objective:    Vital Signs:  Ht 6' (1.829 m)   Wt 280 lb (127 kg)   BMI 37.97 kg/m    VITAL SIGNS:  reviewed  ASSESSMENT & PLAN:    History of ST elevation myocardial infarction  (STEMI) Inferior STEMI July 2017  CAD S/P percutaneous coronary angioplasty RCA PCI with DES July 2017 in setting of STEMI Residual small PDA occlusion  Ischemic cardiomyopathy EF 40-45% by echo July 2019- EF 50-55% May 06, 2018 Essential hypertension Controlled according to him but no actual readings available  HLD- On statin Rx followed by PCP  Obesity (BMI 30-39.9) BMI 37  Non-insulin dependent type 2 diabetes mellitus (HCC) Followed by PCP  COVID-19 Education: The signs and symptoms of COVID-19 were discussed with the patient and how to  seek care for testing (follow up with PCP or arrange E-visit).  The importance of social distancing was discussed today.  Time:   Today, I have spent 15 minutes with the patient with telehealth technology discussing the above problems.     Medication Adjustments/Labs and Tests Ordered: Current medicines are reviewed at length with the patient today.  Concerns regarding medicines are outlined above.   Tests Ordered: No orders of the defined types were placed in this encounter.   Medication Changes: Meds ordered this encounter  Medications  . nitroGLYCERIN (NITROSTAT) 0.4 MG SL tablet    Sig: Place 1 tablet (0.4 mg total) under the tongue every 5 (five) minutes as needed for chest pain (up to 3 doses).    Dispense:  25 tablet    Refill:  3    Follow Up:  In Person 6-8 months with Dr Oval Linsey.  He will need DOT cardiac clearance again before the end of the year.  I'll request records from dr Al Corpus to clarify his medications and review recent labwork.   Angelena Form, PA-C  07/05/2019 3:25 PM    [facility]

## 2019-07-06 ENCOUNTER — Telehealth: Payer: Self-pay

## 2019-07-06 NOTE — Telephone Encounter (Signed)
Left message requesting last OV notes, recent labs and updated med list to be faxed to [phone].

## 2019-07-14 ENCOUNTER — Telehealth: Payer: Self-pay | Admitting: *Deleted

## 2019-07-14 NOTE — Telephone Encounter (Signed)
Spoke to patient, he states his potassium was stopped and he is no longer taking it.     He request I call his wife, Kirk Mckee to discuss.    lmtcb

## 2019-07-14 NOTE — Telephone Encounter (Signed)
-----   Message from Abelino Derrick, [place] sent at 07/13/2019  1:46 PM EDT ----- Darien Ramus reviewed Mr Senner's labs from PCP.  Can you make sure Mr Ahonen is not taking K+ supplement- his last K+ was 5.5.  Thanks  [employer]

## 2019-09-06 ENCOUNTER — Encounter: Payer: Self-pay | Admitting: Cardiology

## 2019-09-06 DIAGNOSIS — Z125 Encounter for screening for malignant neoplasm of prostate: Secondary | ICD-10-CM | POA: Diagnosis not present

## 2019-09-06 DIAGNOSIS — M109 Gout, unspecified: Secondary | ICD-10-CM | POA: Diagnosis not present

## 2019-09-06 DIAGNOSIS — E782 Mixed hyperlipidemia: Secondary | ICD-10-CM | POA: Diagnosis not present

## 2019-09-06 DIAGNOSIS — Z0000 Encounter for general adult medical examination without abnormal findings: Secondary | ICD-10-CM | POA: Diagnosis not present

## 2019-09-06 DIAGNOSIS — E1151 Type 2 diabetes mellitus with diabetic peripheral angiopathy without gangrene: Secondary | ICD-10-CM | POA: Diagnosis not present

## 2019-09-11 DIAGNOSIS — Z9114 Patient's other noncompliance with medication regimen: Secondary | ICD-10-CM | POA: Diagnosis not present

## 2019-09-11 DIAGNOSIS — I2510 Atherosclerotic heart disease of native coronary artery without angina pectoris: Secondary | ICD-10-CM | POA: Diagnosis not present

## 2019-09-11 DIAGNOSIS — I130 Hypertensive heart and chronic kidney disease with heart failure and stage 1 through stage 4 chronic kidney disease, or unspecified chronic kidney disease: Secondary | ICD-10-CM | POA: Diagnosis not present

## 2019-09-11 DIAGNOSIS — E782 Mixed hyperlipidemia: Secondary | ICD-10-CM | POA: Diagnosis not present

## 2019-09-11 DIAGNOSIS — Z0000 Encounter for general adult medical examination without abnormal findings: Secondary | ICD-10-CM | POA: Diagnosis not present

## 2019-10-23 ENCOUNTER — Ambulatory Visit: Payer: BC Managed Care – PPO | Admitting: Allergy and Immunology

## 2019-10-23 ENCOUNTER — Encounter: Payer: Self-pay | Admitting: Allergy and Immunology

## 2019-10-23 VITALS — BP 124/80 | HR 81 | Temp 98.0°F | Resp 20 | Ht 72.87 in | Wt 284.6 lb

## 2019-10-23 DIAGNOSIS — M109 Gout, unspecified: Secondary | ICD-10-CM | POA: Diagnosis not present

## 2019-10-23 DIAGNOSIS — K9049 Malabsorption due to intolerance, not elsewhere classified: Secondary | ICD-10-CM | POA: Diagnosis not present

## 2019-10-23 NOTE — Progress Notes (Signed)
New Patient Note  RE: Kirk Mckee MRN: [id number] DOB: [birth year] Date of Office Visit: 10/23/2019  Referring provider: Haywood Pao, MD Primary care provider: Haywood Pao, MD  Chief Complaint: Food Intolerance  History of present illness: Kirk Mckee is a 39 y.o. male seen today in consultation requested by Domenick Gong, MD.  He reports that since elementary school he would experience symptoms consistent with a gout flare when he would consume beef and/or shrimp.  When he consumes beef he reports that he experiences pain and swelling of his knees and when he consumes shrimp he experiences pain and swelling of his feet.  He reports that he is able to consume crab and lobster without symptoms.  He is also concerned because when he goes out to restaurants, even if he orders so as to avoid beef and shrimp, he will experience gouty flares on occasion without a specific identified trigger.  He takes colchicine as needed.  He denies concomitant urticaria, angioedema of the lips, tongues, or eyelid, cardiopulmonary symptoms, or GI symptoms.  He has no history of symptoms consistent with allergic rhinitis, asthma, or atopic dermatitis.  Assessment and plan: Intolerance, food Skin tests to food allergens were negative today despite a positive histamine control. The negative predictive value of food allergen skin testing is excellent (approximately 95%). While this does not appear to be an IgE mediated issue, skin testing does not rule out food intolerances or idiosyncratic symptoms. These etiologies are suggested when elimination of the responsible food leads to symptom resolution and re-introduction of the food is followed by the return of symptoms.   The patient has been encouraged to keep a careful symptom/food journal and eliminate any food suspected of correlating with symptoms. Should symptoms concerning for anaphylaxis arise, 911 is to be called immediately.  Gout  Limit  intake of foods containing moderate or high levels of purines.   Detailed information has been discussed and provided.  Continue colchicine as prescribed as needed.   Diagnostics: Food allergen skin testing: Negative despite a positive histamine control.    Physical examination: Blood pressure 124/80, pulse 81, temperature 98 F (36.7 C), temperature source Oral, resp. rate 20, height 6' 0.87" (1.851 m), weight 284 lb 9.8 oz (129.1 kg), SpO2 97 %.  General: Alert, interactive, in no acute distress. Neck: Supple without lymphadenopathy. Lungs: Clear to auscultation without wheezing, rhonchi or rales. CV: Normal S1, S2 without murmurs. Abdomen: Nondistended, nontender. Skin: Warm and dry, without lesions or rashes. Extremities:  No clubbing, cyanosis or edema. Neuro:   Grossly intact.  Review of systems:  Review of systems negative except as noted in HPI / PMHx or noted below: Review of Systems  Constitutional: Negative.   HENT: Negative.   Eyes: Negative.   Respiratory: Negative.   Cardiovascular: Negative.   Gastrointestinal: Negative.   Genitourinary: Negative.   Musculoskeletal: Negative.   Skin: Negative.   Neurological: Negative.   Endo/Heme/Allergies: Negative.   Psychiatric/Behavioral: Negative.     Past medical history:  Past Medical History:  Diagnosis Date  . Acute combined systolic and diastolic heart failure ([place])    a. 10/2015: EF 35-40% by cath, 40-45% by echo.  . Bursitis of knee   . CAD (coronary artery disease)    a. 10/2015: STEMI s/p PTCA/DES to mid-distal RCA with overlapping stent due to proximal edge dissection, right PDA occlusion too small for PCI.  . Diabetes mellitus type 2 in obese ([place])   . Hyperlipidemia   .  Hyperlipidemia   . Hypertensive heart disease   . Ischemic cardiomyopathy   . Obesity   . Sinus bradycardia    a. HR 50s prevented further med titration of BB.  . Tobacco abuse     Past surgical history:  Past Surgical  History:  Procedure Laterality Date  . arm sugery    . CARDIAC CATHETERIZATION N/A 11/02/2015   Procedure: Left Heart Cath and Coronary Angiography;  Surgeon: Iran Ouch, MD;  [facility];  Service: Cardiovascular;  Laterality: N/A;  . CARDIAC CATHETERIZATION N/A 11/02/2015   Procedure: Coronary Stent Intervention;  Surgeon: Iran Ouch, MD;  Location: [facility];  Service: Cardiovascular;  Laterality: N/A;    Family history: Family History  Problem Relation Age of Onset  . Hypertension Mother   . Food Allergy Mother     Social history: Social History   Socioeconomic History  . Marital status: Single    Spouse name: Not on file  . Number of children: Not on file  . Years of education: Not on file  . Highest education level: Not on file  Occupational History  . Not on file  Tobacco Use  . Smoking status: Former Smoker    Packs/day: 0.50    Types: Cigarettes  . Smokeless tobacco: Never Used  Vaping Use  . Vaping Use: Former  Substance and Sexual Activity  . Alcohol use: No    Alcohol/week: 0.0 standard drinks  . Drug use: No  . Sexual activity: Yes  Other Topics Concern  . Not on file  Social History Narrative  . Not on file   Social Determinants of Health   Financial Resource Strain:   . Difficulty of Paying Living Expenses:   Food Insecurity:   . Worried About Programme researcher, broadcasting/film/video in the Last Year:   . Barista in the Last Year:   Transportation Needs:   . Freight forwarder (Medical):   Marland Kitchen Lack of Transportation (Non-Medical):   Physical Activity:   . Days of Exercise per Week:   . Minutes of Exercise per Session:   Stress:   . Feeling of Stress :   Social Connections:   . Frequency of Communication with Friends and Family:   . Frequency of Social Gatherings with Friends and Family:   . Attends Religious Services:   . Active Member of Clubs or Organizations:   . Attends Banker Meetings:   Marland Kitchen  Marital Status:   Intimate Partner Violence:   . Fear of Current or Ex-Partner:   . Emotionally Abused:   Marland Kitchen Physically Abused:   . Sexually Abused:     Environmental History: The patient lives in an apartment with carpeting throughout and central air/heat.  There is no known mold/water damage in the home.  There is 1 dog and 1 cat in the home which have access to his bedroom.  He is a former smoker having quit 4 years ago.  Current Outpatient Medications  Medication Sig Dispense Refill  . aspirin EC 81 MG EC tablet Take 1 tablet (81 mg total) by mouth daily. 30 tablet 11  . atorvastatin (LIPITOR) 80 MG tablet Take 1 tablet (80 mg total) by mouth daily at 6 PM. 30 tablet 3  . carvedilol (COREG) 3.125 MG tablet TAKE 1 TABLET(3.125 MG) BY MOUTH TWICE DAILY WITH A MEAL 180 tablet 2  . colchicine 0.6 MG tablet Take 1 tablet by mouth as directed.  5  .  furosemide (LASIX) 20 MG tablet Take 1 tablet (20 mg total) by mouth daily. 30 tablet 2  . JARDIANCE 25 MG TABS tablet Take 25 mg by mouth daily.    Marland Kitchen lisinopril (ZESTRIL) 5 MG tablet Take 1 tablet (5 mg total) by mouth daily. 30 tablet 2  . nitroGLYCERIN (NITROSTAT) 0.4 MG SL tablet Place 1 tablet (0.4 mg total) under the tongue every 5 (five) minutes as needed for chest pain (up to 3 doses). 25 tablet 3  . prasugrel (EFFIENT) 10 MG TABS tablet Take 1 tablet (10 mg total) by mouth daily. 30 tablet 11   No current facility-administered medications for this visit.    Known medication allergies: Allergies  Allergen Reactions  . Naprosyn [Naproxen] Hives    I appreciate the opportunity to take part in Kirk Mckee care. Please do not hesitate to contact me with questions.  Sincerely,   R. Jorene Guest, MD

## 2019-10-23 NOTE — Assessment & Plan Note (Signed)
   Limit intake of foods containing moderate or high levels of purines.   Detailed information has been discussed and provided.  Continue colchicine as prescribed as needed.

## 2019-10-23 NOTE — Assessment & Plan Note (Addendum)
Skin tests to food allergens were negative today despite a positive histamine control. The negative predictive value of food allergen skin testing is excellent (approximately 95%). While this does not appear to be an IgE mediated issue, skin testing does not rule out food intolerances or idiosyncratic symptoms. These etiologies are suggested when elimination of the responsible food leads to symptom resolution and re-introduction of the food is followed by the return of symptoms.   The patient has been encouraged to keep a careful symptom/food journal and eliminate any food suspected of correlating with symptoms. Should symptoms concerning for anaphylaxis arise, 911 is to be called immediately.

## 2019-10-23 NOTE — Patient Instructions (Addendum)
Intolerance, food Skin tests to food allergens were negative today despite a positive histamine control. The negative predictive value of food allergen skin testing is excellent (approximately 95%). While this does not appear to be an IgE mediated issue, skin testing does not rule out food intolerances or idiosyncratic symptoms. These etiologies are suggested when elimination of the responsible food leads to symptom resolution and re-introduction of the food is followed by the return of symptoms.   The patient has been encouraged to keep a careful symptom/food journal and eliminate any food suspected of correlating with symptoms. Should symptoms concerning for anaphylaxis arise, 911 is to be called immediately.  Gout  Limit intake of foods containing moderate or high levels of purines.   Detailed information has been discussed and provided.  Continue colchicine as prescribed as needed.   Follow-up if needed.  The primary dietary goal for gout is to limit your intake of foods with high amounts of purine in them. Ideally, you will have little or no foods that are high in purine and only small amounts of those with moderate amounts of purine.  Foods considered high in purine content include:  Some fish, seafood and shellfish, including anchovies, sardines, mackerel, scallops, herring, mussels, codfish, trout, and haddock Some meats such as bacon, [place], veal, venison, liver, beef kidney, brain, and sweetbreads Alcoholic beverages Foods considered moderate in purine content include:  Meats such as beef, veal, poultry, pork, and lamb Crab, lobster, oysters, and shrimp Vegetables such as asparagus, spinach, green peas, mushrooms, and cauliflower Kidney beans, lentils, and lima beans Your other goals will be to:  Lose weight if you are overweight: Make sure that you do this slowly because fast weight loss will actually increase the uric acid levels in your blood. Drink plenty of fluids:  This can help with removing uric acid from your blood. Be sure to limit fluids with caffeine and/or calories; water and seltzer are the best choices. Increase your lowfat dairy intake: There has been some research that has shown that those who drink lowfat milk or consume lowfat yogurt have lower uric acid levels than those who do not. Keep your fruit and vegetable intake up: You may get a reduction in your uric acid levels by having fruit, such as cherries, and vegetables (those that are not sources of purine), as part of your diet. It's still important to have a well-balanced diet. If you have any trouble doing so with these recommendations, I would recommend working with a dietitian to design a plan that fits your preferences and lifestyle. You can find one in your area by speaking with your physician.

## 2019-12-25 DIAGNOSIS — E7800 Pure hypercholesterolemia, unspecified: Secondary | ICD-10-CM | POA: Diagnosis not present

## 2019-12-25 DIAGNOSIS — E1151 Type 2 diabetes mellitus with diabetic peripheral angiopathy without gangrene: Secondary | ICD-10-CM | POA: Diagnosis not present

## 2020-01-12 ENCOUNTER — Ambulatory Visit: Payer: BC Managed Care – PPO | Admitting: Cardiovascular Disease

## 2020-01-12 ENCOUNTER — Encounter: Payer: Self-pay | Admitting: Cardiovascular Disease

## 2020-01-12 ENCOUNTER — Other Ambulatory Visit: Payer: Self-pay

## 2020-01-12 VITALS — BP 130/78 | HR 72 | Temp 97.3°F | Ht 72.0 in | Wt 282.6 lb

## 2020-01-12 DIAGNOSIS — Z9861 Coronary angioplasty status: Secondary | ICD-10-CM

## 2020-01-12 DIAGNOSIS — Z0289 Encounter for other administrative examinations: Secondary | ICD-10-CM

## 2020-01-12 DIAGNOSIS — E785 Hyperlipidemia, unspecified: Secondary | ICD-10-CM

## 2020-01-12 DIAGNOSIS — I10 Essential (primary) hypertension: Secondary | ICD-10-CM

## 2020-01-12 DIAGNOSIS — I5041 Acute combined systolic (congestive) and diastolic (congestive) heart failure: Secondary | ICD-10-CM | POA: Diagnosis not present

## 2020-01-12 DIAGNOSIS — R001 Bradycardia, unspecified: Secondary | ICD-10-CM

## 2020-01-12 DIAGNOSIS — I2510 Atherosclerotic heart disease of native coronary artery without angina pectoris: Secondary | ICD-10-CM

## 2020-01-12 NOTE — Patient Instructions (Addendum)
Medication Instructions:  Your physician recommends that you continue on your current medications as directed. Please refer to the Current Medication list given to you today.  *If you need a refill on your cardiac medications before your next appointment, please call your pharmacy*  Lab Work: REQUESTED RECENT LABS FROM DR TISOVEC  YOU WILL NEED A COVID SCREENING TEST 3 DAYS PRIOR TO YOUR TREADMILL TEST   If you have labs (blood work) drawn today and your tests are completely normal, you will receive your results only by: Marland Kitchen MyChart Message (if you have MyChart) OR . A paper copy in the mail If you have any lab test that is abnormal or we need to change your treatment, we will call you to review the results.  Testing/Procedures: Your physician has requested that you have an exercise tolerance test. For further information please visit [email or web address] Please also follow instruction sheet, as given. DO NOT TAKE YOUR CARVEDILOL THE NIGHT BEFORE OR MORNING OF YOUR TEST   Follow-Up: At [facility], you and your health needs are our priority.  As part of our continuing mission to provide you with exceptional heart care, we have created designated Provider Care Teams.  These Care Teams include your primary Cardiologist (physician) and Advanced Practice Providers (APPs -  Physician Assistants and Nurse Practitioners) who all work together to provide you with the care you need, when you need it.  We recommend signing up for the patient portal called "MyChart".  Sign up information is provided on this After Visit Summary.  MyChart is used to connect with patients for Virtual Visits (Telemedicine).  Patients are able to view lab/test results, encounter notes, upcoming appointments, etc.  Non-urgent messages can be sent to your provider as well.   To learn more about what you can do with MyChart, go to [email or web address].    Your next appointment:   12 month(s) You will receive a reminder  letter in the mail two months in advance. If you don't receive a letter, please call our office to schedule the follow-up appointment.  The format for your next appointment:   In Person  Provider:   You may see DR  or one of the following Advanced Practice Providers on your designated Care Team:    Corine Shelter, PA-C  [place], [place]  Edd Fabian, [place]

## 2020-01-12 NOTE — Progress Notes (Signed)
Cardiology Office Note   Date:  01/12/2020   ID:  Kirk Mckee, DOB Sep 09, 1980, MRN [id number]  PCP:  Kirk Garbe, MD  Cardiologist:   Kirk Si, MD   No chief complaint on file.   History of Present Illness: Kirk Mckee is a 39 y.o. male with diabetes mellitus type 2, hypertension, hyperlipidemia, CAD s/p STEMI, tobacco abuse, and chronic systolic and diastolic heart failure who presents for follow up.  Kirk Mckee was admitted 10/2015 with a STEMI and had a DES to the mid-distal RCA with overlapping stent due to proximal edge dissection.  There was also an occlusion of the PDA that was too small for PCI.  He was also diagnosed with diabetes during that hospitalization with glucose >400.  His hemoglobin A1c was 10.6%.  Echo revealed LVEF 40-45% with moderate hypokinesis of the inferior/inferoseptal/apical myocardium.   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 He last saw Kirk Mckee 06/2019 and was feeling well. He continues to feel well. He is very active at work but doesn't get much exercise outside of that. He does not check his blood pressure regularly at home.  When he has it checked with his primary care doctor it is typically well-controlled.  Overall he has been feeling well.  He has no exertional chest pain or shortness of breath.  He denies lower extremity edema, orthopnea, or PND.  It is hard for him to get exercise because he leaves work at 4 AM and gets home at 5.  By the time he gets home he is tired.  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  Work Past Medical History:  Diagnosis Date  . Acute combined systolic and diastolic heart failure ([facility])    a. 10/2015: EF 35-40% by cath, 40-45% by echo.  . Bursitis of knee   . CAD (coronary artery disease)    a. 10/2015: STEMI s/p PTCA/DES to mid-distal RCA with overlapping stent due to proximal edge dissection, right PDA occlusion too small for PCI.  . Diabetes mellitus type 2 in obese ([facility])   .  Hyperlipidemia   . Hyperlipidemia   . Hypertensive heart disease   . Ischemic cardiomyopathy   . Obesity   . Sinus bradycardia    a. HR 50s prevented further med titration of BB.  . Tobacco abuse     Past Surgical History:  Procedure Laterality Date  . arm sugery    . CARDIAC CATHETERIZATION N/A 11/02/2015   Procedure: Left Heart Cath and Coronary Angiography;  Surgeon: Kirk Ouch, MD;  [facility];  Service: Cardiovascular;  Laterality: N/A;  . CARDIAC CATHETERIZATION N/A 11/02/2015   Procedure: Coronary Stent Intervention;  Surgeon: Kirk Ouch, MD;  [facility];  Service: Cardiovascular;  Laterality: N/A;     Current Outpatient Medications  Medication Sig Dispense Refill  . aspirin EC 81 MG EC tablet Take 1 tablet (81 mg total) by mouth daily. 30 tablet 11  . atorvastatin (LIPITOR) 80 MG tablet Take 1 tablet (80 mg total) by mouth daily at 6 PM. 30 tablet 3  . carvedilol (COREG) 3.125 MG tablet TAKE 1 TABLET(3.125 MG) BY MOUTH TWICE DAILY WITH A MEAL 180 tablet 2  . colchicine 0.6 MG tablet Take 1 tablet by mouth as directed.  5  . furosemide (LASIX) 20 MG tablet Take 1 tablet (20 mg total) by mouth daily. 30 tablet 2  . JARDIANCE 25 MG TABS tablet Take  25 mg by mouth daily.    Marland Kitchen lisinopril (ZESTRIL) 5 MG tablet Take 1 tablet (5 mg total) by mouth daily. 30 tablet 2  . nitroGLYCERIN (NITROSTAT) 0.4 MG SL tablet Place 1 tablet (0.4 mg total) under the tongue every 5 (five) minutes as needed for chest pain (up to 3 doses). 25 tablet 3   No current facility-administered medications for this visit.    Allergies:   Naprosyn [naproxen]    Social History:  The patient  reports that he has quit smoking. His smoking use included cigarettes. He smoked 0.50 packs per day. He has never used smokeless tobacco. He reports that he does not drink alcohol and does not use drugs.   Family History:  The patient's family history includes Food Allergy in  his mother; Hypertension in his mother.    ROS:  Please see the history of present illness.  Otherwise, review of systems are positive for none.   All other systems are reviewed and negative.    PHYSICAL EXAM: VS:  BP 130/78   Pulse 72   Temp (!) 97.3 F (36.3 C)   Ht 6' (1.829 m)   Wt 282 lb 9.6 oz (128.2 kg)   SpO2 94%   BMI 38.33 kg/m  , BMI Body mass index is 38.33 kg/m. GENERAL:  Well appearing HEENT: Pupils equal round and reactive, fundi not visualized, oral mucosa unremarkable NECK:  No jugular venous distention, waveform within normal limits, carotid upstroke brisk and symmetric, no bruits LUNGS:  Clear to auscultation bilaterally HEART:  RRR.  PMI not displaced or sustained,S1 and S2 within normal limits, no S3, no S4, no clicks, no rubs, no murmurs ABD:  Flat, positive bowel sounds normal in frequency in pitch, no bruits, no rebound, no guarding, no midline pulsatile mass, no hepatomegaly, no splenomegaly EXT:  2 plus pulses throughout, no edema, no cyanosis no clubbing SKIN:  No rashes no nodules NEURO:  Cranial nerves II through XII grossly intact, motor grossly intact throughout PSYCH:  Cognitively intact, oriented to person place and time   EKG:  EKG is ordered today. The ekg ordered 01/02/16 demonstrates sinus bradycardia rate 54 bpm.  Prior inferior infarct. 09/03/17: Sinus rhythm. Rate 71 bpm. Prior inferior infarct. 01/12/2020: Sinus rhythm.  Rate 72 bpm.  Prior inferior infarct.  Echo 11/02/15: Study Conclusions  - Left ventricle: The cavity size was normal. Systolic function was   mildly to moderately reduced. The estimated ejection fraction was   in the range of 40% to 45%. Moderate hypokinesis of the inferior,   inferoseptal, and apical myocardium; consistent with ischemia in   the distribution of the right coronary artery. The tissue Doppler   parameters were mildly abnormal for his young age. Left   ventricular diastolic function parameters were normal.  Acoustic   contrast opacification revealed no evidence ofthrombus.  LHC 11/02/15:  RPDA lesion, 100% stenosed.  Mid RCA to Dist RCA lesion, 100% stenosed. Post intervention, there is a 0% residual stenosis.  There is mild to moderate left ventricular systolic dysfunction.   1. Severe one-vessel coronary artery disease with thrombotic occlusion of the mid to distal right coronary artery with evidence of embolization into the distal right PDA. No other obstructive disease. 2. Mild to moderate reduction of LV systolic function with an ejection fraction of 35-45% with distal inferior wall hypokinesis. 3. Successful balloon angioplasty and drug-eluting stent placement to the mid to distal right coronary artery. A second overlapping stent was placed more proximally  due to proximal edge dissection.  Recent Labs: No results found for requested labs within last 8760 hours.    Lipid Panel    Component Value Date/Time   CHOL 298 (H) 11/02/2015 0420   TRIG 181 (H) 11/02/2015 0420   HDL 36 (L) 11/02/2015 0420   CHOLHDL 8.3 11/02/2015 0420   VLDL 36 11/02/2015 0420   LDLCALC 226 (H) 11/02/2015 0420      Wt Readings from Last 3 Encounters:  01/12/20 282 lb 9.6 oz (128.2 kg)  10/23/19 284 lb 9.8 oz (129.1 kg)  07/05/19 280 lb (127 kg)      ASSESSMENT AND PLAN:  # CAD s/p RCA STEMI:  # Hyperlipidemia: Mr. Repka is doing very well post-MI.  He had 100% stenosis of the RPDA and distal RCA.  This was successfully stented.  LVEF improved after PCI and medical management.  He has no angina and is doing well on aspirin, atorvastatin, carvedilol.  He had lipids recently checked with his PCP.  We will get a copy.  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  He has no symptoms.   # Hypertension: Blood pressure well-controlled.  Continue carvedilol and lisinopril.    # Chronic systolic and diastolic heart failure: No heart failure on exam and no symptoms. Continue carvedilol and  lisinopril.   Current medicines are reviewed at length with the patient today.  The patient does not have concerns regarding medicines.  The following changes have been made:  none  Labs/ tests ordered today include:   Orders Placed This Encounter  Procedures  . Exercise Tolerance Test     Disposition:   FU with Amritha Yorke C. Duke Salvia, MD, [facility] in 1 year.   This note was written with the assistance of speech recognition software.  Please excuse any transcriptional errors.  Signed, Skylar Flynt C. Duke Salvia, MD, [facility]  01/12/2020 11:05 PM    [facility]

## 2020-01-17 NOTE — Addendum Note (Signed)
Addended by: Kevontae Burgoon E on: 01/17/2020 03:35 PM   Modules accepted: Orders  

## 2020-03-29 DIAGNOSIS — U071 COVID-19: Secondary | ICD-10-CM | POA: Diagnosis not present

## 2020-04-05 DIAGNOSIS — U071 COVID-19: Secondary | ICD-10-CM | POA: Diagnosis not present

## 2020-04-07 DIAGNOSIS — Z20822 Contact with and (suspected) exposure to covid-19: Secondary | ICD-10-CM | POA: Diagnosis not present

## 2020-04-07 DIAGNOSIS — R439 Unspecified disturbances of smell and taste: Secondary | ICD-10-CM | POA: Diagnosis not present

## 2020-04-07 DIAGNOSIS — Z8616 Personal history of COVID-19: Secondary | ICD-10-CM | POA: Diagnosis not present

## 2020-04-09 ENCOUNTER — Other Ambulatory Visit (HOSPITAL_COMMUNITY): Payer: BC Managed Care – PPO

## 2020-04-12 ENCOUNTER — Ambulatory Visit (HOSPITAL_COMMUNITY)
Admission: RE | Admit: 2020-04-12 | Payer: BC Managed Care – PPO | Source: Ambulatory Visit | Attending: Cardiovascular Disease | Admitting: Cardiovascular Disease

## 2020-04-16 ENCOUNTER — Telehealth (HOSPITAL_COMMUNITY): Payer: Self-pay | Admitting: Cardiovascular Disease

## 2020-04-16 ENCOUNTER — Encounter (HOSPITAL_COMMUNITY): Payer: Self-pay | Admitting: Cardiovascular Disease

## 2020-04-16 NOTE — Telephone Encounter (Signed)
Just an FYI. We have made several attempts to contact this patient including sending a letter to schedule or reschedule their GXT. We will be removing the patient from the echo/TST WQ.   04/12/20 NO SHOW -MAILED LETTER [facility]     Thank you

## 2020-04-17 NOTE — Telephone Encounter (Signed)
OK thank you 

## 2020-05-07 ENCOUNTER — Inpatient Hospital Stay (HOSPITAL_COMMUNITY)
Admission: RE | Admit: 2020-05-07 | Discharge: 2020-05-07 | Disposition: A | Discharge status: Home / Self Care | Payer: BC Managed Care – PPO | Source: Ambulatory Visit

## 2020-05-07 ENCOUNTER — Other Ambulatory Visit: Payer: Self-pay | Admitting: *Deleted

## 2020-05-07 DIAGNOSIS — I2510 Atherosclerotic heart disease of native coronary artery without angina pectoris: Secondary | ICD-10-CM

## 2020-05-07 NOTE — Progress Notes (Signed)
Pt not tested for covid today due to pt testing + for covid on 03/29/20, results found in Care Everywhere. Based on the guidelines the pt is in the 90 day window to not retest. The pt is still expected to quarantine until their procedure. Therefore, the pt can still have the scheduled procedure. These are the guidelines as follows:  Guidance: Patient previously tested + COVID; now past 90 day window seeking elective surgery (asymptomatic)  Retest patient If negative, proceed with surgery If positive, postpone surgery for 10 days from positive test Patient to quarantine for the (10 days) Do not retest again prior to surgery (even if scheduled a couple of weeks out) Use standard precautions for surgery   Viviano Simas, RN

## 2020-05-10 ENCOUNTER — Ambulatory Visit (HOSPITAL_COMMUNITY)
Admission: RE | Admit: 2020-05-10 | Discharge: 2020-05-10 | Disposition: A | Discharge status: Home / Self Care | Payer: BC Managed Care – PPO | Source: Ambulatory Visit | Attending: Cardiovascular Disease | Admitting: Cardiovascular Disease

## 2020-05-10 ENCOUNTER — Other Ambulatory Visit: Payer: Self-pay

## 2020-05-10 DIAGNOSIS — I2510 Atherosclerotic heart disease of native coronary artery without angina pectoris: Secondary | ICD-10-CM | POA: Insufficient documentation

## 2020-05-10 DIAGNOSIS — Z9861 Coronary angioplasty status: Secondary | ICD-10-CM | POA: Diagnosis not present

## 2020-05-10 DIAGNOSIS — Z0289 Encounter for other administrative examinations: Secondary | ICD-10-CM | POA: Diagnosis not present

## 2020-05-10 LAB — EXERCISE TOLERANCE TEST
Estimated workload: 10.1 METS
Exercise duration (min): 8 min
Exercise duration (sec): 24 s
MPHR: 179 {beats}/min
Peak HR: 162 {beats}/min
Percent HR: 90 %
Rest HR: 65 {beats}/min

## 2021-02-25 ENCOUNTER — Ambulatory Visit (INDEPENDENT_AMBULATORY_CARE_PROVIDER_SITE_OTHER): Payer: BC Managed Care – PPO | Admitting: Cardiovascular Disease

## 2021-02-25 ENCOUNTER — Other Ambulatory Visit: Payer: Self-pay

## 2021-02-25 ENCOUNTER — Encounter (HOSPITAL_BASED_OUTPATIENT_CLINIC_OR_DEPARTMENT_OTHER): Payer: Self-pay | Admitting: Cardiovascular Disease

## 2021-02-25 VITALS — BP 110/70 | HR 85 | Ht 72.0 in | Wt 278.3 lb

## 2021-02-25 DIAGNOSIS — E118 Type 2 diabetes mellitus with unspecified complications: Secondary | ICD-10-CM

## 2021-02-25 DIAGNOSIS — E1169 Type 2 diabetes mellitus with other specified complication: Secondary | ICD-10-CM

## 2021-02-25 DIAGNOSIS — E669 Obesity, unspecified: Secondary | ICD-10-CM

## 2021-02-25 DIAGNOSIS — I10 Essential (primary) hypertension: Secondary | ICD-10-CM

## 2021-02-25 DIAGNOSIS — I5042 Chronic combined systolic (congestive) and diastolic (congestive) heart failure: Secondary | ICD-10-CM

## 2021-02-25 DIAGNOSIS — F17201 Nicotine dependence, unspecified, in remission: Secondary | ICD-10-CM

## 2021-02-25 MED ORDER — OZEMPIC (0.25 OR 0.5 MG/DOSE) 2 MG/1.5ML ~~LOC~~ SOPN: PEN_INJECTOR | SUBCUTANEOUS | 1 refills | Status: AC

## 2021-02-25 MED ORDER — OZEMPIC (1 MG/DOSE) 4 MG/3ML ~~LOC~~ SOPN: PEN_INJECTOR | SUBCUTANEOUS | 5 refills | Status: AC

## 2021-02-25 NOTE — Assessment & Plan Note (Signed)
Blood pressures well-controlled on carvedilol and lisinopril.

## 2021-02-25 NOTE — Assessment & Plan Note (Signed)
Most recent hemoglobin A1c was 7.0% 11/2019.  He will have a repeat hemoglobin A1c checked.  Continue metformin and Jardiance.  We will add Ozempic as above.

## 2021-02-25 NOTE — Assessment & Plan Note (Signed)
He was congratulated for continue to abstain from smoking.

## 2021-02-25 NOTE — Patient Instructions (Signed)
Medication Instructions:  START OZEMPIC  INJECT 0.25 MG WEEKLY FOR 4 WEEKS  INCREASE TO 0.5 MG WEEKLY FOR 4 WEEKS INCREASE TO 1 MG WEEKLY FOR 4 WEEKS  *If you need a refill on your cardiac medications before your next appointment, please call your pharmacy*  Lab Work: FASTING LP/CMET/A1C SOON   If you have labs (blood work) drawn today and your tests are completely normal, you will receive your results only by: MyChart Message (if you have MyChart) OR A paper copy in the mail If you have any lab test that is abnormal or we need to change your treatment, we will call you to review the results.  Testing/Procedures: NONE   Follow-Up: At [facility], you and your health needs are our priority.  As part of our continuing mission to provide you with exceptional heart care, we have created designated Provider Care Teams.  These Care Teams include your primary Cardiologist (physician) and Advanced Practice Providers (APPs -  Physician Assistants and Nurse Practitioners) who all work together to provide you with the care you need, when you need it.  We recommend signing up for the patient portal called "MyChart".  Sign up information is provided on this After Visit Summary.  MyChart is used to connect with patients for Virtual Visits (Telemedicine).  Patients are able to view lab/test results, encounter notes, upcoming appointments, etc.  Non-urgent messages can be sent to your provider as well.   To learn more about what you can do with MyChart, go to [email or web address].    Your next appointment:   12 month(s)  The format for your next appointment:   In Person  Provider:   DR [facility]

## 2021-02-25 NOTE — Assessment & Plan Note (Signed)
LVEF was 40 to 45% at the time of his STEMI.  It improved to 50% with guideline directed medical therapy and PCI.  He is doing well clinically and has no angina.  We discussed increasing his exercise to least 150 minutes weekly.  Continue aspirin, carvedilol, and atorvastatin.  He will come back for fasting lipids and a CMP.  His LDL goal is less than 70.

## 2021-02-25 NOTE — Progress Notes (Signed)
Cardiology Office Note   Date:  02/25/2021   ID:  Kirk Mckee, DOB 01-23-1981, MRN [id number]  PCP:  Gaspar Garbe, MD  Cardiologist:   Chilton Si, MD   No chief complaint on file.   History of Present Illness: Kirk Mckee is a 40 y.o. male with diabetes mellitus type 2, hypertension, hyperlipidemia, CAD s/p STEMI, tobacco abuse, and chronic systolic and diastolic heart failure who presents for follow up.  Kirk Mckee was admitted 10/2015 with a STEMI and had a DES to the mid-distal RCA with overlapping stent due to proximal edge dissection.  There was also an occlusion of the PDA that was too small for PCI.  He was also diagnosed with diabetes during that hospitalization with glucose >400.  His hemoglobin A1c was 10.6%.  Echo revealed LVEF 40-45% with moderate hypokinesis of the inferior/inferoseptal/apical myocardium.   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 He was feeling well and blood pressure was well controlled. He had an ETT 04/2020 that was low-risk. He achieved 10.1 METS on a Bruce protocol.  Today, he states he is doing well. He has been getting plenty of exercise, and walks regularly. He stays active with work, unloading and loading trucks. Lately he has noticed that he still has a lot of energy left after being active. Since his last visit he has successfully lost some weight. He has been watching what he eats for his gout flare ups, and is not currently fasting. He continues to avoid smoking. He denies any palpitations, chest pain, or shortness of breath. No lightheadedness, headaches, syncope, orthopnea, PND, lower extremity edema or exertional symptoms.    Past Medical History:  Diagnosis Date   Acute combined systolic and diastolic heart failure (HCC)    a. 10/2015: EF 35-40% by cath, 40-45% by echo.   Bursitis of knee    CAD (coronary artery disease)    a. 10/2015: STEMI s/p PTCA/DES to mid-distal RCA with overlapping stent due to proximal edge dissection,  right PDA occlusion too small for PCI.   Diabetes mellitus type 2 in obese (HCC)    Hyperlipidemia    Hyperlipidemia    Hypertensive heart disease    Ischemic cardiomyopathy    Obesity    Sinus bradycardia    a. HR 50s prevented further med titration of BB.   Tobacco abuse    Tobacco abuse, in remission     Past Surgical History:  Procedure Laterality Date   arm sugery     CARDIAC CATHETERIZATION N/A 11/02/2015   Procedure: Left Heart Cath and Coronary Angiography;  Surgeon: Iran Ouch, MD;  Location: [facility] INVASIVE CV LAB;  Service: Cardiovascular;  Laterality: N/A;   CARDIAC CATHETERIZATION N/A 11/02/2015   Procedure: Coronary Stent Intervention;  Surgeon: Iran Ouch, MD;  Location: [facility] INVASIVE CV LAB;  Service: Cardiovascular;  Laterality: N/A;     Current Outpatient Medications  Medication Sig Dispense Refill   aspirin EC 81 MG EC tablet Take 1 tablet (81 mg total) by mouth daily. 30 tablet 11   atorvastatin (LIPITOR) 80 MG tablet Take 1 tablet (80 mg total) by mouth daily at 6 PM. 30 tablet 3   carvedilol (COREG) 3.125 MG tablet TAKE 1 TABLET(3.125 MG) BY MOUTH TWICE DAILY WITH A MEAL 180 tablet 2   colchicine 0.6 MG tablet Take 1 tablet by mouth as directed.  5   furosemide (LASIX) 20 MG tablet Take 1 tablet (20 mg total) by mouth daily. 30 tablet  2   JARDIANCE 25 MG TABS tablet Take 25 mg by mouth daily.     lisinopril (ZESTRIL) 5 MG tablet Take 1 tablet (5 mg total) by mouth daily. 30 tablet 2   metFORMIN (GLUCOPHAGE-XR) 500 MG 24 hr tablet Take 1,000 mg by mouth 2 (two) times daily.     nitroGLYCERIN (NITROSTAT) 0.4 MG SL tablet Place 1 tablet (0.4 mg total) under the tongue every 5 (five) minutes as needed for chest pain (up to 3 doses). 25 tablet 3   potassium chloride SA (KLOR-CON) 20 MEQ tablet Take 20 mEq by mouth daily.     Semaglutide, 1 MG/DOSE, (OZEMPIC, 1 MG/DOSE,) 4 MG/3ML SOPN AFTER YOU FINISH FIRST 8 WEEKS INJECT 1 MG WEEKLY FOR 4 WEEKS THEN INCREASE TO 2  MG WEEKLY 1 mL 5   Semaglutide,0.25 or 0.5MG /DOS, (OZEMPIC, 0.25 OR 0.5 MG/DOSE,) 2 MG/1.5ML SOPN INJECT 0.25 MG WEEKLY FOR 4 WEEKS THEN INCREASE TO 0.5 MG WEEKLY FOR 4 WEEKS 1 mL 1   No current facility-administered medications for this visit.    Allergies:   Naprosyn [naproxen]    Social History:  The patient  reports that he has quit smoking. His smoking use included cigarettes. He smoked an average of .5 packs per day. He has never used smokeless tobacco. He reports that he does not drink alcohol and does not use drugs.   Family History:  The patient's family history includes Food Allergy in his mother; Hypertension in his mother.    ROS:   Please see the history of present illness.   (+) All other systems are reviewed and negative.    PHYSICAL EXAM: VS:  BP 110/70 (BP Location: Left Arm, Patient Position: Sitting, Cuff Size: Large)   Pulse 85   Ht 6' (1.829 m)   Wt 278 lb 4.8 oz (126.2 kg)   BMI 37.74 kg/m  , BMI Body mass index is 37.74 kg/m. GENERAL:  Well appearing HEENT: Pupils equal round and reactive, fundi not visualized, oral mucosa unremarkable NECK:  No jugular venous distention, waveform within normal limits, carotid upstroke brisk and symmetric, no bruits LUNGS:  Clear to auscultation bilaterally HEART:  RRR.  PMI not displaced or sustained,S1 and S2 within normal limits, no S3, no S4, no clicks, no rubs, no murmurs ABD:  Flat, positive bowel sounds normal in frequency in pitch, no bruits, no rebound, no guarding, no midline pulsatile mass, no hepatomegaly, no splenomegaly EXT:  2 plus pulses throughout, no edema, no cyanosis no clubbing SKIN:  No rashes no nodules NEURO:  Cranial nerves II through XII grossly intact, motor grossly intact throughout PSYCH:  Cognitively intact, oriented to person place and time   EKG:   02/25/2021: Sinus Rhythm. Rate 85 bpm.  The ekg ordered 01/02/16 demonstrates sinus bradycardia rate 54 bpm.  Prior inferior infarct. 09/03/17:  Sinus rhythm. Rate 71 bpm. Prior inferior infarct. 01/12/2020: Sinus rhythm.  Rate 72 bpm.  Prior inferior infarct.  Echo 11/02/15: Study Conclusions   - Left ventricle: The cavity size was normal. Systolic function was   mildly to moderately reduced. The estimated ejection fraction was   in the range of 40% to 45%. Moderate hypokinesis of the inferior,   inferoseptal, and apical myocardium; consistent with ischemia in   the distribution of the right coronary artery. The tissue Doppler   parameters were mildly abnormal for his young age. Left   ventricular diastolic function parameters were normal. Acoustic   contrast opacification revealed no evidence ofthrombus.  LHC 11/02/15: RPDA lesion, 100% stenosed. Mid RCA to Dist RCA lesion, 100% stenosed. Post intervention, there is a 0% residual stenosis. There is mild to moderate left ventricular systolic dysfunction.   1. Severe one-vessel coronary artery disease with thrombotic occlusion of the mid to distal right coronary artery with evidence of embolization into the distal right PDA. No other obstructive disease. 2. Mild to moderate reduction of LV systolic function with an ejection fraction of 35-45% with distal inferior wall hypokinesis. 3. Successful balloon angioplasty and drug-eluting stent placement to the mid to distal right coronary artery. A second overlapping stent was placed more proximally due to proximal edge dissection.  Recent Labs: No results found for requested labs within last 8760 hours.    Lipid Panel    Component Value Date/Time   CHOL 298 (H) 11/02/2015 0420   TRIG 181 (H) 11/02/2015 0420   HDL 36 (L) 11/02/2015 0420   CHOLHDL 8.3 11/02/2015 0420   VLDL 36 11/02/2015 0420   LDLCALC 226 (H) 11/02/2015 0420      Wt Readings from Last 3 Encounters:  02/25/21 278 lb 4.8 oz (126.2 kg)  01/12/20 282 lb 9.6 oz (128.2 kg)  10/23/19 284 lb 9.8 oz (129.1 kg)      ASSESSMENT AND PLAN:  Chronic combined systolic  and diastolic heart failure (HCC) LVEF was 40 to 45% at the time of his STEMI.  It improved to 50% with guideline directed medical therapy and PCI.  He is doing well clinically and has no angina.  We discussed increasing his exercise to least 150 minutes weekly.  Continue aspirin, carvedilol, and atorvastatin.  He will come back for fasting lipids and a CMP.  His LDL goal is less than 70.  Essential hypertension Blood pressures well-controlled on carvedilol and lisinopril.  Obesity (BMI 30-39.9) He is struggling with weight loss.  We discussed increasing his cardio.  We will also start Ozempic 0.25 mg weekly for 4 weeks followed by 0.5 mg weekly for 4 weeks followed by 1 mg weekly.  He also has follow-up with his PCP next month.  He will get fasting lipids, CMP, and hemoglobin A1c checked.  Tobacco abuse, in remission He was congratulated for continue to abstain from smoking.  Diabetes mellitus type 2 in obese (HCC) Most recent hemoglobin A1c was 7.0% 11/2019.  He will have a repeat hemoglobin A1c checked.  Continue metformin and Jardiance.  We will add Ozempic as above.   Current medicines are reviewed at length with the patient today.  The patient does not have concerns regarding medicines.  The following changes have been made:  none  Labs/ tests ordered today include:   Orders Placed This Encounter  Procedures   Lipid panel   Comprehensive metabolic panel   HgB A1c   EKG 12-Lead   I, Eulia Hatcher C. Duke Salvia, MD have reviewed all documentation for this visit.  The documentation of the exam, diagnosis, procedures, and orders on 02/25/2021 are all accurate and complete.    Disposition:   FU with Maciah Schweigert C. Duke Salvia, MD, [facility] in 1 year.   This note was written with the assistance of speech recognition software.  Please excuse any transcriptional errors.  Signed, Lana Flaim C. Duke Salvia, MD, [facility]  02/25/2021 3:17 PM    [facility]

## 2021-02-25 NOTE — Assessment & Plan Note (Signed)
He is struggling with weight loss.  We discussed increasing his cardio.  We will also start Ozempic 0.25 mg weekly for 4 weeks followed by 0.5 mg weekly for 4 weeks followed by 1 mg weekly.  He also has follow-up with his PCP next month.  He will get fasting lipids, CMP, and hemoglobin A1c checked.

## 2021-03-07 DIAGNOSIS — E7800 Pure hypercholesterolemia, unspecified: Secondary | ICD-10-CM | POA: Diagnosis not present

## 2021-03-07 DIAGNOSIS — M109 Gout, unspecified: Secondary | ICD-10-CM | POA: Diagnosis not present

## 2021-03-07 DIAGNOSIS — Z125 Encounter for screening for malignant neoplasm of prostate: Secondary | ICD-10-CM | POA: Diagnosis not present

## 2021-03-07 DIAGNOSIS — E1151 Type 2 diabetes mellitus with diabetic peripheral angiopathy without gangrene: Secondary | ICD-10-CM | POA: Diagnosis not present

## 2021-03-14 DIAGNOSIS — E1151 Type 2 diabetes mellitus with diabetic peripheral angiopathy without gangrene: Secondary | ICD-10-CM | POA: Diagnosis not present

## 2021-03-14 DIAGNOSIS — Z1389 Encounter for screening for other disorder: Secondary | ICD-10-CM | POA: Diagnosis not present

## 2021-03-14 DIAGNOSIS — Z1331 Encounter for screening for depression: Secondary | ICD-10-CM | POA: Diagnosis not present

## 2021-03-14 DIAGNOSIS — I130 Hypertensive heart and chronic kidney disease with heart failure and stage 1 through stage 4 chronic kidney disease, or unspecified chronic kidney disease: Secondary | ICD-10-CM | POA: Diagnosis not present

## 2021-03-14 DIAGNOSIS — Z0000 Encounter for general adult medical examination without abnormal findings: Secondary | ICD-10-CM | POA: Diagnosis not present

## 2021-06-25 DIAGNOSIS — I130 Hypertensive heart and chronic kidney disease with heart failure and stage 1 through stage 4 chronic kidney disease, or unspecified chronic kidney disease: Secondary | ICD-10-CM | POA: Diagnosis not present

## 2021-06-25 DIAGNOSIS — E1151 Type 2 diabetes mellitus with diabetic peripheral angiopathy without gangrene: Secondary | ICD-10-CM | POA: Diagnosis not present

## 2021-10-16 DIAGNOSIS — E1151 Type 2 diabetes mellitus with diabetic peripheral angiopathy without gangrene: Secondary | ICD-10-CM | POA: Diagnosis not present

## 2021-10-16 DIAGNOSIS — E7800 Pure hypercholesterolemia, unspecified: Secondary | ICD-10-CM | POA: Diagnosis not present

## 2022-01-29 ENCOUNTER — Encounter (HOSPITAL_BASED_OUTPATIENT_CLINIC_OR_DEPARTMENT_OTHER): Payer: Self-pay | Admitting: Emergency Medicine

## 2022-01-29 ENCOUNTER — Other Ambulatory Visit: Payer: Self-pay

## 2022-01-29 ENCOUNTER — Emergency Department (HOSPITAL_BASED_OUTPATIENT_CLINIC_OR_DEPARTMENT_OTHER)
Admission: EM | Admit: 2022-01-29 | Discharge: 2022-01-30 | Disposition: A | Discharge status: Home / Self Care | Payer: BC Managed Care – PPO | Attending: Emergency Medicine | Admitting: Emergency Medicine

## 2022-01-29 ENCOUNTER — Emergency Department (HOSPITAL_BASED_OUTPATIENT_CLINIC_OR_DEPARTMENT_OTHER): Payer: BC Managed Care – PPO

## 2022-01-29 DIAGNOSIS — Z7982 Long term (current) use of aspirin: Secondary | ICD-10-CM | POA: Insufficient documentation

## 2022-01-29 DIAGNOSIS — D72829 Elevated white blood cell count, unspecified: Secondary | ICD-10-CM | POA: Insufficient documentation

## 2022-01-29 DIAGNOSIS — L0291 Cutaneous abscess, unspecified: Secondary | ICD-10-CM

## 2022-01-29 DIAGNOSIS — L02211 Cutaneous abscess of abdominal wall: Secondary | ICD-10-CM | POA: Diagnosis not present

## 2022-01-29 DIAGNOSIS — Z7984 Long term (current) use of oral hypoglycemic drugs: Secondary | ICD-10-CM | POA: Insufficient documentation

## 2022-01-29 DIAGNOSIS — E119 Type 2 diabetes mellitus without complications: Secondary | ICD-10-CM | POA: Insufficient documentation

## 2022-01-29 DIAGNOSIS — K3189 Other diseases of stomach and duodenum: Secondary | ICD-10-CM | POA: Diagnosis not present

## 2022-01-29 LAB — CBC WITH DIFFERENTIAL/PLATELET
Abs Immature Granulocytes: 0.06 10*3/uL (ref 0.00–0.07)
Basophils Absolute: 0.1 10*3/uL (ref 0.0–0.1)
Basophils Relative: 0 %
Eosinophils Absolute: 1.1 10*3/uL — ABNORMAL HIGH (ref 0.0–0.5)
Eosinophils Relative: 7 %
HCT: 45 % (ref 39.0–52.0)
Hemoglobin: 14.6 g/dL (ref 13.0–17.0)
Immature Granulocytes: 0 %
Lymphocytes Relative: 18 %
Lymphs Abs: 2.9 10*3/uL (ref 0.7–4.0)
MCH: 27.8 pg (ref 26.0–34.0)
MCHC: 32.4 g/dL (ref 30.0–36.0)
MCV: 85.7 fL (ref 80.0–100.0)
Monocytes Absolute: 1.4 10*3/uL — ABNORMAL HIGH (ref 0.1–1.0)
Monocytes Relative: 9 %
Neutro Abs: 10.4 10*3/uL — ABNORMAL HIGH (ref 1.7–7.7)
Neutrophils Relative %: 66 %
Platelets: 298 10*3/uL (ref 150–400)
RBC: 5.25 MIL/uL (ref 4.22–5.81)
RDW: 13.2 % (ref 11.5–15.5)
WBC: 16 10*3/uL — ABNORMAL HIGH (ref 4.0–10.5)
nRBC: 0 % (ref 0.0–0.2)

## 2022-01-29 LAB — COMPREHENSIVE METABOLIC PANEL
ALT: 21 U/L (ref 0–44)
AST: 21 U/L (ref 15–41)
Albumin: 3.9 g/dL (ref 3.5–5.0)
Alkaline Phosphatase: 90 U/L (ref 38–126)
Anion gap: 10 (ref 5–15)
BUN: 10 mg/dL (ref 6–20)
CO2: 26 mmol/L (ref 22–32)
Calcium: 8.8 mg/dL — ABNORMAL LOW (ref 8.9–10.3)
Chloride: 101 mmol/L (ref 98–111)
Creatinine, Ser: 0.99 mg/dL (ref 0.61–1.24)
GFR, Estimated: >60 mL/min (ref >60)
Glucose, Bld: 153 mg/dL — ABNORMAL HIGH (ref 70–99)
Potassium: 3.5 mmol/L (ref 3.5–5.1)
Sodium: 137 mmol/L (ref 135–145)
Total Bilirubin: 1.3 mg/dL — ABNORMAL HIGH (ref 0.3–1.2)
Total Protein: 8.1 g/dL (ref 6.5–8.1)

## 2022-01-29 LAB — LACTIC ACID, PLASMA
Lactic Acid, Venous: 2.5 mmol/L (ref 0.5–1.9)
Lactic Acid, Venous: 1.6 mmol/L (ref 0.5–1.9)

## 2022-01-29 MED ORDER — SODIUM CHLORIDE 0.9 % IV BOLUS
1000.0000 mL | Freq: Once | INTRAVENOUS | Status: AC
  Administered 2022-01-29: 1000 mL via INTRAVENOUS

## 2022-01-29 MED ORDER — AMOXICILLIN-POT CLAVULANATE 875-125 MG PO TABS
1.0000 | ORAL_TABLET | Freq: Once | ORAL | Status: AC
  Administered 2022-01-30: 1 via ORAL
  Filled 2022-01-29: qty 1

## 2022-01-29 MED ORDER — OXYCODONE-ACETAMINOPHEN 5-325 MG PO TABS
1.0000 | ORAL_TABLET | Freq: Once | ORAL | Status: AC
  Administered 2022-01-30: 1 via ORAL
  Filled 2022-01-29: qty 1

## 2022-01-29 MED ORDER — LIDOCAINE HCL (PF) 1 % IJ SOLN
5.0000 mL | Freq: Once | INTRAMUSCULAR | Status: AC
  Administered 2022-01-29: 5 mL
  Filled 2022-01-29: qty 5

## 2022-01-29 MED ORDER — IOHEXOL 300 MG/ML  SOLN
125.0000 mL | Freq: Once | INTRAMUSCULAR | Status: AC | PRN
  Administered 2022-01-29: 125 mL via INTRAVENOUS

## 2022-01-29 MED ORDER — AMOXICILLIN-POT CLAVULANATE 875-125 MG PO TABS: 1.0000 | ORAL_TABLET | Freq: Two times a day (BID) | ORAL | 0 refills | Status: DC

## 2022-01-29 MED ORDER — ACETAMINOPHEN 325 MG PO TABS
650.0000 mg | ORAL_TABLET | Freq: Once | ORAL | Status: AC
  Administered 2022-01-29: 650 mg via ORAL
  Filled 2022-01-29: qty 2

## 2022-01-29 NOTE — Discharge Instructions (Addendum)
It was a pleasure taking care of you today!  Your labs show concern for elevated white blood cell count, likely in the setting of infection.  Also elevated glucose today.  The lactic was initially elevated however resolved with IV fluids.  The area was drained today.  Packing was placed to the area, it is important that this does not stay in for more than 48 hours, you may remove it at home.  Also important for close follow-up within the next week with your primary care provider regarding today's ED visit.  You will be sent a prescription for Augmentin, take as prescribed.  Return to the emergency department if you are experiencing increasing/worsening symptoms.

## 2022-01-29 NOTE — ED Provider Notes (Addendum)
[facility] [place] EMERGENCY DEPARTMENT Provider Note   CSN: [id number] Arrival date & time: 01/29/22  [id number]     History  Chief Complaint  Patient presents with   Abscess    Kirk Mckee is a 41 y.o. male who presents to the Emergency Department complaining of right lower quadrant abscess onset last Saturday. He has associated drainage, subjective fever. Pt tried oil ease for his symptoms. Denies  nausea, vomiting, chest pain, shortness of breath. Takes metformin, jardiance, and ozempic for his DM. Has a PCP.    The history is provided by the patient. No language interpreter was used.       Home Medications Prior to Admission medications   Medication Sig Start Date End Date Taking? Authorizing Provider  amoxicillin-clavulanate (AUGMENTIN) 875-125 MG tablet Take 1 tablet by mouth every 12 (twelve) hours. 01/29/22  Yes Aylah Yeary A, PA-C  aspirin EC 81 MG EC tablet Take 1 tablet (81 mg total) by mouth daily. 11/04/15   Dunn, Nedra Hai, PA-C  atorvastatin (LIPITOR) 80 MG tablet Take 1 tablet (80 mg total) by mouth daily at 6 PM. 06/26/16   Skeet Latch, MD  carvedilol (COREG) 3.125 MG tablet TAKE 1 TABLET(3.125 MG) BY MOUTH TWICE DAILY WITH A MEAL 07/07/19   Skeet Latch, MD  colchicine 0.6 MG tablet Take 1 tablet by mouth as directed. 02/07/18   [provider]  furosemide (LASIX) 20 MG tablet Take 1 tablet (20 mg total) by mouth daily. 01/12/19   Skeet Latch, MD  JARDIANCE 25 MG TABS tablet Take 25 mg by mouth daily. 03/03/19   [provider]  lisinopril (ZESTRIL) 5 MG tablet Take 1 tablet (5 mg total) by mouth daily. 01/12/19   Skeet Latch, MD  metFORMIN (GLUCOPHAGE-XR) 500 MG 24 hr tablet Take 1,000 mg by mouth 2 (two) times daily. 01/15/21   [provider]  nitroGLYCERIN (NITROSTAT) 0.4 MG SL tablet Place 1 tablet (0.4 mg total) under the tongue every 5 (five) minutes as needed for chest pain (up to 3 doses). 07/05/19   Erlene Quan,  PA-C  potassium chloride SA (KLOR-CON) 20 MEQ tablet Take 20 mEq by mouth daily. 11/29/20   [provider]  Semaglutide, 1 MG/DOSE, (OZEMPIC, 1 MG/DOSE,) 4 MG/3ML SOPN AFTER YOU FINISH FIRST 8 WEEKS INJECT 1 MG WEEKLY FOR 4 WEEKS THEN INCREASE TO 2 MG WEEKLY 02/25/21   Skeet Latch, MD  Semaglutide,0.25 or 0.5MG /DOS, (OZEMPIC, 0.25 OR 0.5 MG/DOSE,) 2 MG/1.5ML SOPN INJECT 0.25 MG WEEKLY FOR 4 WEEKS THEN INCREASE TO 0.5 MG WEEKLY FOR 4 WEEKS 02/25/21   Skeet Latch, MD      Allergies    Naprosyn [naproxen]    Review of Systems   Review of Systems  Constitutional:  Positive for fever (subjective).  Respiratory:  Negative for shortness of breath.   Cardiovascular:  Negative for chest pain.  All other systems reviewed and are negative.   Physical Exam Updated Vital Signs BP 134/83   Pulse 91   Temp 99.6 F (37.6 C) (Oral)   Resp 18   Ht 6' (1.829 m)   Wt 115.7 kg   SpO2 98%   BMI 34.58 kg/m  Physical Exam Vitals and nursing note reviewed.  Constitutional:      General: He is not in acute distress.    Appearance: He is not diaphoretic.  HENT:     Head: Normocephalic and atraumatic.     Mouth/Throat:     Pharynx: No oropharyngeal exudate.  Eyes:     General: No scleral icterus.    Conjunctiva/sclera: Conjunctivae normal.  Cardiovascular:     Rate and Rhythm: Normal rate and regular rhythm.     Pulses: Normal pulses.     Heart sounds: Normal heart sounds.  Pulmonary:     Effort: Pulmonary effort is normal. No respiratory distress.     Breath sounds: Normal breath sounds. No wheezing.  Abdominal:     General: Bowel sounds are normal.     Palpations: Abdomen is soft. There is no mass.     Tenderness: There is no abdominal tenderness. There is no guarding or rebound.  Musculoskeletal:        General: Normal range of motion.     Cervical back: Normal range of motion and neck supple.  Skin:    General: Skin is warm and dry.     Comments: 3 cm area of  fluctuance noted to the right lower quadrant with surrounding erythema and increased warmth noted to the area.  Tissue protruding out of area of drainage.  Neurological:     Mental Status: He is alert.  Psychiatric:        Behavior: Behavior normal.     ED Results / Procedures / Treatments   Labs (all labs ordered are listed, but only abnormal results are displayed) Labs Reviewed  LACTIC ACID, PLASMA - Abnormal; Notable for the following components:      Result Value   Lactic Acid, Venous 2.5 (*)    All other components within normal limits  COMPREHENSIVE METABOLIC PANEL - Abnormal; Notable for the following components:   Glucose, Bld 153 (*)    Calcium 8.8 (*)    Total Bilirubin 1.3 (*)    All other components within normal limits  CBC WITH DIFFERENTIAL/PLATELET - Abnormal; Notable for the following components:   WBC 16.0 (*)    Neutro Abs 10.4 (*)    Monocytes Absolute 1.4 (*)    Eosinophils Absolute 1.1 (*)    All other components within normal limits  LACTIC ACID, PLASMA  URINALYSIS, ROUTINE W REFLEX MICROSCOPIC    EKG None  Radiology CT ABDOMEN PELVIS W CONTRAST  Result Date: 01/29/2022 CLINICAL DATA:  Right lower abdominal abscess EXAM: CT ABDOMEN AND PELVIS WITH CONTRAST TECHNIQUE: Multidetector CT imaging of the abdomen and pelvis was performed using the standard protocol following bolus administration of intravenous contrast. RADIATION DOSE REDUCTION: This exam was performed according to the departmental dose-optimization program which includes automated exposure control, adjustment of the mA and/or kV according to patient size and/or use of iterative reconstruction technique. CONTRAST:  119mL OMNIPAQUE IOHEXOL 300 MG/ML  SOLN COMPARISON:  None Available. FINDINGS: Lower chest: No pleural or pericardial effusion. Coronary calcifications. Visualized lung bases clear. Hepatobiliary: No focal liver abnormality is seen. No gallstones, gallbladder wall thickening, or biliary  dilatation. Pancreas: Unremarkable. No pancreatic ductal dilatation or surrounding inflammatory changes. Spleen: Normal in size without focal abnormality. Adrenals/Urinary Tract: Adrenal glands are unremarkable. Kidneys are normal, without renal calculi, focal lesion, or hydronephrosis. Bladder is unremarkable. Stomach/Bowel: Stomach is partially distended by ingested material. The small bowel is nondilated. Normal appendix. The colon is incompletely distended, unremarkable. Vascular/Lymphatic: Mild aortoiliac calcified atheromatous plaque without aneurysm or evident stenosis. No abdominal or pelvic adenopathy. Portal vein patent. Reproductive: Mild prostate enlargement. Other: Bilateral pelvic phleboliths.  No ascites.  No free air. Musculoskeletal: Elongated 3.1 x 1.6 cm dermal/subdermal fluid collection in the right lower quadrant anterior abdominal wall, with regional skin thickening  and subcutaneous inflammatory/edematous change. Facet DJD L5-S1. No acute osseous finding. IMPRESSION: 1. 3.1 cm right lower quadrant subdermal abscess with regional skin thickening and subcutaneous inflammatory changes. 2. Coronary and Aortic Atherosclerosis (ICD10-170.0). Electronically Signed   By: Lucrezia Europe M.D.   On: 01/29/2022 21:01    Procedures .Marland KitchenIncision and Drainage  Date/Time: 01/30/2022 12:35 AM  Performed by: Oren Binet, Stevin Bielinski A, PA-C Authorized by: Nehemiah Settle, PA-C   Consent:    Consent obtained:  Verbal   Consent given by:  Patient   Risks, benefits, and alternatives were discussed: yes     Risks discussed:  Bleeding, incomplete drainage and pain Universal protocol:    Imaging studies available: yes     Patient identity confirmed:  Verbally with patient and hospital-assigned identification number Location:    Type:  Abscess   Size:  3 cm   Location:  Trunk   Trunk location:  Abdomen Pre-procedure details:    Skin preparation:  Povidone-iodine Sedation:    Sedation type:  None Anesthesia:     Anesthesia method:  Local infiltration   Local anesthetic:  Lidocaine 1% w/o epi Procedure type:    Complexity:  Simple Procedure details:    Incision types:  Single straight   Wound management:  Probed and deloculated and irrigated with saline   Drainage:  Purulent and bloody   Drainage amount:  Moderate   Packing materials:  1/4 in iodoform gauze   Amount 1/4" iodoform:  1 cm Post-procedure details:    Procedure completion:  Tolerated well, no immediate complications     Medications Ordered in ED Medications  oxyCODONE-acetaminophen (PERCOCET/ROXICET) 5-325 MG per tablet 1 tablet (has no administration in time range)  amoxicillin-clavulanate (AUGMENTIN) 875-125 MG per tablet 1 tablet (has no administration in time range)  iohexol (OMNIPAQUE) 300 MG/ML solution 125 mL (125 mLs Intravenous Contrast Given 01/29/22 2039)  sodium chloride 0.9 % bolus 1,000 mL (0 mLs Intravenous Stopped 01/29/22 2227)  acetaminophen (TYLENOL) tablet 650 mg (650 mg Oral Given 01/29/22 2143)  lidocaine (PF) (XYLOCAINE) 1 % injection 5 mL (5 mLs Infiltration Given 01/29/22 2227)    ED Course/ Medical Decision Making/ A&P                           Medical Decision Making Amount and/or Complexity of Data Reviewed Labs: ordered. Radiology: ordered.  Risk OTC drugs. Prescription drug management.   Pt presents with abscess noted to the right lower quadrant onset 5 days.  Has a history of diabetes and is compliant with his metformin, Jardiance, Ozempic.  On exam patient with 3 cm area of fluctuance noted to right lower quadrant with surrounding erythema increased warmth noted.  Differential diagnosis includes  abscess, cellulitis, intra-abdominal abnormality, sepsis doubt.    Co morbidities that complicate the patient evaluation: DM  Additional history obtained:  Additional history obtained from Spouse/Significant Other  Labs:  I ordered, and personally interpreted labs.  The pertinent results  include:   CBC with leukocytosis of 16 otherwise unremarkable CMP with slightly elevated glucose and bilirubin Initial lactic at at 2.5 repeat lactic at 1.6  Imaging: I ordered imaging studies including CT abdomen pelvis with I independently visualized and interpreted imaging which showed:  1. 3.1 cm right lower quadrant subdermal abscess with regional skin  thickening and subcutaneous inflammatory changes.  2. Coronary and Aortic Atherosclerosis (ICD10-170.0).   I agree with the radiologist interpretation  Medications:  I ordered medication including  IVF, tylenol, percocet, augmentin, for management Reevaluation of the patient after these medicines and interventions, I reevaluated the patient and found that they have improved I have reviewed the patients home medicines and have made adjustments as needed   Disposition: Presentation suspicious for abscess.  Sepsis at this time.  No concerns at this time for cellulitis.  Doubt concerns at this time for intra-abdominal abnormality.  After consideration of the diagnostic results and the patients response to treatment, I feel that the patient would benefit from Discharge home.  Wound packed, patient and wife instructed to remove packing within 48 hours.Patient sent home with a prescription for Augmentin with strict instructions to follow-up with primary care provider regarding today's ED visit supportive care measures and strict return precautions discussed with patient at bedside. Pt acknowledges and verbalizes understanding. Pt appears safe for discharge. Follow up as indicated in discharge paperwork.    This chart was dictated using voice recognition software, Dragon. Despite the best efforts of this provider to proofread and correct errors, errors may still occur which can change documentation meaning.   Final Clinical Impression(s) / ED Diagnoses Final diagnoses:  Abscess    Rx / DC Orders ED Discharge Orders          Ordered     amoxicillin-clavulanate (AUGMENTIN) 875-125 MG tablet  Every 12 hours        01/29/22 2354              Lilyian Quayle A, PA-C 01/30/22 0000    Sloane Palmer A, PA-C 01/30/22 0036    Tretha Sciara, MD 01/30/22 1455

## 2022-01-29 NOTE — ED Triage Notes (Signed)
Abscess on right lower abdomen x 1 week. States abscess popped today and purulent drainage is coming out. Overall feeling "not well" Area is red, swollen, draining, tender and warm to touch.

## 2022-03-27 DIAGNOSIS — E7800 Pure hypercholesterolemia, unspecified: Secondary | ICD-10-CM | POA: Diagnosis not present

## 2022-03-27 DIAGNOSIS — Z125 Encounter for screening for malignant neoplasm of prostate: Secondary | ICD-10-CM | POA: Diagnosis not present

## 2022-03-27 DIAGNOSIS — M109 Gout, unspecified: Secondary | ICD-10-CM | POA: Diagnosis not present

## 2022-03-27 DIAGNOSIS — E1151 Type 2 diabetes mellitus with diabetic peripheral angiopathy without gangrene: Secondary | ICD-10-CM | POA: Diagnosis not present

## 2022-04-03 DIAGNOSIS — I130 Hypertensive heart and chronic kidney disease with heart failure and stage 1 through stage 4 chronic kidney disease, or unspecified chronic kidney disease: Secondary | ICD-10-CM | POA: Diagnosis not present

## 2022-04-03 DIAGNOSIS — Z1331 Encounter for screening for depression: Secondary | ICD-10-CM | POA: Diagnosis not present

## 2022-04-03 DIAGNOSIS — Z1339 Encounter for screening examination for other mental health and behavioral disorders: Secondary | ICD-10-CM | POA: Diagnosis not present

## 2022-04-03 DIAGNOSIS — Z0000 Encounter for general adult medical examination without abnormal findings: Secondary | ICD-10-CM | POA: Diagnosis not present

## 2022-04-03 DIAGNOSIS — I209 Angina pectoris, unspecified: Secondary | ICD-10-CM | POA: Diagnosis not present

## 2022-04-22 ENCOUNTER — Telehealth: Payer: Self-pay | Admitting: Cardiovascular Disease

## 2022-04-22 MED ORDER — FUROSEMIDE 20 MG PO TABS: 20.0000 mg | ORAL_TABLET | Freq: Every day | ORAL | 0 refills | Status: DC

## 2022-04-22 NOTE — Telephone Encounter (Signed)
*  STAT* If patient is at the pharmacy, call can be transferred to refill team.   1. Which medications need to be refilled? (please list name of each medication and dose if known) furosemide (LASIX) 20 MG tablet   2. Which pharmacy/location (including street and city if local pharmacy) is medication to be sent to? [id number] - [place], [place] - [address] AT [facility] [address]   3. Do they need a 30 day or 90 day supply? 30

## 2022-05-05 ENCOUNTER — Ambulatory Visit (HOSPITAL_BASED_OUTPATIENT_CLINIC_OR_DEPARTMENT_OTHER): Payer: BC Managed Care – PPO | Admitting: Family

## 2022-05-19 ENCOUNTER — Encounter (HOSPITAL_BASED_OUTPATIENT_CLINIC_OR_DEPARTMENT_OTHER): Payer: Self-pay | Admitting: Family

## 2022-05-19 ENCOUNTER — Ambulatory Visit (INDEPENDENT_AMBULATORY_CARE_PROVIDER_SITE_OTHER): Payer: BC Managed Care – PPO | Admitting: Family

## 2022-05-19 VITALS — BP 120/86 | HR 68 | Ht 72.0 in | Wt 243.0 lb

## 2022-05-19 DIAGNOSIS — I10 Essential (primary) hypertension: Secondary | ICD-10-CM

## 2022-05-19 DIAGNOSIS — I5022 Chronic systolic (congestive) heart failure: Secondary | ICD-10-CM | POA: Diagnosis not present

## 2022-05-19 DIAGNOSIS — E785 Hyperlipidemia, unspecified: Secondary | ICD-10-CM | POA: Diagnosis not present

## 2022-05-19 DIAGNOSIS — I25118 Atherosclerotic heart disease of native coronary artery with other forms of angina pectoris: Secondary | ICD-10-CM

## 2022-05-19 DIAGNOSIS — Z6832 Body mass index (BMI) 32.0-32.9, adult: Secondary | ICD-10-CM | POA: Diagnosis not present

## 2022-05-19 MED ORDER — NITROGLYCERIN 0.4 MG SL SUBL: 0.4000 mg | SUBLINGUAL_TABLET | SUBLINGUAL | 3 refills | Status: AC | PRN

## 2022-05-19 MED ORDER — CARVEDILOL 3.125 MG PO TABS: 3.1250 mg | ORAL_TABLET | Freq: Two times a day (BID) | ORAL | 3 refills | Status: AC

## 2022-05-19 MED ORDER — LISINOPRIL 5 MG PO TABS: 5.0000 mg | ORAL_TABLET | Freq: Every day | ORAL | 3 refills | Status: DC

## 2022-05-19 MED ORDER — ATORVASTATIN CALCIUM 80 MG PO TABS: 80.0000 mg | ORAL_TABLET | Freq: Every day | ORAL | 3 refills | Status: AC

## 2022-05-19 MED ORDER — FUROSEMIDE 20 MG PO TABS: 20.0000 mg | ORAL_TABLET | Freq: Every day | ORAL | 3 refills | Status: DC

## 2022-05-19 NOTE — Progress Notes (Signed)
Office Visit    Patient Name: [name] Date of Encounter: 05/19/2022  PCP:  Haywood Pao, MD   Kirk Mckee  Cardiologist:  Skeet Latch, MD  Advanced Practice Provider:  No care team member to display Electrophysiologist:  None   Chief Complaint    Kirk Mckee is a 42 y.o. male presents today for follow up of heart failure   Past Medical History    Past Medical History:  Diagnosis Date   Acute combined systolic and diastolic heart failure ([place])    a. 10/2015: EF 35-40% by cath, 40-45% by echo.   Bursitis of knee    CAD (coronary artery disease)    a. 10/2015: STEMI s/p PTCA/DES to mid-distal RCA with overlapping stent due to proximal edge dissection, right PDA occlusion too small for PCI.   Diabetes mellitus type 2 in obese (HCC)    Hyperlipidemia    Hyperlipidemia    Hypertensive heart disease    Ischemic cardiomyopathy    Obesity    Sinus bradycardia    a. HR 50s prevented further med titration of BB.   Tobacco abuse    Tobacco abuse, in remission    Past Surgical History:  Procedure Laterality Date   arm sugery     CARDIAC CATHETERIZATION N/A 11/02/2015   Procedure: Left Heart Cath and Coronary Angiography;  Surgeon: Wellington Hampshire, MD;  Location: [place];  Service: Cardiovascular;  Laterality: N/A;   CARDIAC CATHETERIZATION N/A 11/02/2015   Procedure: Coronary Stent Intervention;  Surgeon: Wellington Hampshire, MD;  Location: [facility];  Service: Cardiovascular;  Laterality: N/A;    Allergies  Allergies  Allergen Reactions   Naprosyn [Naproxen] Hives    History of Present Illness    Kirk Mckee is a 42 y.o. male with a hx of DM2, hypertension, hyperlipidemia, CAD s/p STEMI, tobacco abuse, chronic systolic and diastolic heart failure last seen 02/25/21.  10/2015 with STEMI and DES of mid distal RCA with overlapping stent to the proximal edge dissection.  Also occlusion of PDA too small for PCI.  Diagnosed  with diabetes during the hospitalization with glucose greater than 400.  A1c of 10.6.  Echo with LVEF 40 to 45%, moderate hypokinesis of the inferior/inferoseptal/apical myocardium.  Repeat echo 03/2018 LVEF 50-55%.  Seen 12/2019 for DOT clearance.  He was doing well.  ETT 04/2020 low risk study able to achieve 10.1 METS on Bruce protocol.  Last seen 02/25/2021.  He was doing well from a cardiac perspective.  No changes were made at that time.  He presents today for follow-up independently. Doing well since last seen. 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 Enjoys fishing in his spare time. No formal exercise routine. Endorses following heart healthy diet.   He is taking Semsglutide 0.25mg . He notes he went up to the 1mg  dose and was having troubles with constipation as well as throwing up. He tolerated the 0.5mg  dose. HE attributes it to eating too much when the symptoms happen. He has been back taking the 0.25mg  dose a month ago.   Reports no shortness of breath nor dyspnea on exertion. Reports no chest pain, pressure, or tightness. No edema, orthopnea, PND. Reports no palpitations.    EKGs/Labs/Other Studies Reviewed:   The following studies were reviewed today:  Echo 04/06/18 - Left ventricle: The cavity size was normal. Systolic function was    normal. The estimated ejection fraction was  in the range of 50%    to 55%. Wall motion was normal; there were no regional wall    motion abnormalities. Left ventricular diastolic function    parameters were normal. Longitudinal strain, 2D: -17.2 %. The    calculated 3D left ventricular ejection fraction was 53 %  Echo 11/02/15: Study Conclusions   - Left ventricle: The cavity size was normal. Systolic function was   mildly to moderately reduced. The estimated ejection fraction was   in the range of 40% to 45%. Moderate hypokinesis of the inferior,   inferoseptal, and apical myocardium; consistent with ischemia in   the  distribution of the right coronary artery. The tissue Doppler   parameters were mildly abnormal for his young age. Left   ventricular diastolic function parameters were normal. Acoustic   contrast opacification revealed no evidence ofthrombus.   LHC 11/02/15: RPDA lesion, 100% stenosed. Mid RCA to Dist RCA lesion, 100% stenosed. Post intervention, there is a 0% residual stenosis. There is mild to moderate left ventricular systolic dysfunction.   1. Severe one-vessel coronary artery disease with thrombotic occlusion of the mid to distal right coronary artery with evidence of embolization into the distal right PDA. No other obstructive disease. 2. Mild to moderate reduction of LV systolic function with an ejection fraction of 35-45% with distal inferior wall hypokinesis. 3. Successful balloon angioplasty and drug-eluting stent placement to the mid to distal right coronary artery. A second overlapping stent was placed more proximally due to proximal edge dissection.  EKG:  EKG is ordered today.  The ekg ordered today demonstrates NSR 68 bpm with TWI lead III. No acute ST/T wave changes.   Recent Labs: 01/29/2022: ALT 21; BUN 10; Creatinine, Ser 0.99; Hemoglobin 14.6; Platelets 298; Potassium 3.5; Sodium 137  Recent Lipid Panel    Component Value Date/Time   CHOL 298 (H) 11/02/2015 0420   TRIG 181 (H) 11/02/2015 0420   HDL 36 (L) 11/02/2015 0420   CHOLHDL 8.3 11/02/2015 0420   VLDL 36 11/02/2015 0420   LDLCALC 226 (H) 11/02/2015 0420     Home Medications   Current Meds  Medication Sig   amoxicillin-clavulanate (AUGMENTIN) 875-125 MG tablet Take 1 tablet by mouth every 12 (twelve) hours.   aspirin EC 81 MG EC tablet Take 1 tablet (81 mg total) by mouth daily.   atorvastatin (LIPITOR) 80 MG tablet Take 1 tablet (80 mg total) by mouth daily at 6 PM.   carvedilol (COREG) 3.125 MG tablet TAKE 1 TABLET(3.125 MG) BY MOUTH TWICE DAILY WITH A MEAL   colchicine 0.6 MG tablet Take 1 tablet by  mouth as directed.   furosemide (LASIX) 20 MG tablet Take 1 tablet (20 mg total) by mouth daily. PLEASE KEEP UPCOMING APPOINTMENT FOR FURTHER REFILLS   JARDIANCE 25 MG TABS tablet Take 25 mg by mouth daily.   lisinopril (ZESTRIL) 5 MG tablet Take 1 tablet (5 mg total) by mouth daily.   metFORMIN (GLUCOPHAGE-XR) 500 MG 24 hr tablet Take 1,000 mg by mouth 2 (two) times daily.   nitroGLYCERIN (NITROSTAT) 0.4 MG SL tablet Place 1 tablet (0.4 mg total) under the tongue every 5 (five) minutes as needed for chest pain (up to 3 doses).   potassium chloride SA (KLOR-CON) 20 MEQ tablet Take 20 mEq by mouth daily.   Semaglutide, 1 MG/DOSE, (OZEMPIC, 1 MG/DOSE,) 4 MG/3ML SOPN AFTER YOU FINISH FIRST 8 WEEKS INJECT 1 MG WEEKLY FOR 4 WEEKS THEN INCREASE TO 2 MG WEEKLY   Semaglutide,0.25  or 0.5MG /DOS, (OZEMPIC, 0.25 OR 0.5 MG/DOSE,) 2 MG/1.5ML SOPN INJECT 0.25 MG WEEKLY FOR 4 WEEKS THEN INCREASE TO 0.5 MG WEEKLY FOR 4 WEEKS     Review of Systems      All other systems reviewed and are otherwise negative except as noted above.  Physical Exam    VS:  BP 120/86   Pulse 68   Ht 6' (1.829 m)   Wt 243 lb (110.2 kg)   SpO2 97%   BMI 32.96 kg/m  , BMI Body mass index is 32.96 kg/m.  Wt Readings from Last 3 Encounters:  05/19/22 243 lb (110.2 kg)  01/29/22 255 lb (115.7 kg)  02/25/21 278 lb 4.8 oz (126.2 kg)     GEN: Well nourished, well developed, in no acute distress. HEENT: normal. Neck: Supple, no JVD, carotid bruits, or masses. Cardiac: RRR, no murmurs, rubs, or gallops. No clubbing, cyanosis, edema.  Radials/PT 2+ and equal bilaterally.  Respiratory:  Respirations regular and unlabored, clear to auscultation bilaterally. GI: Soft, nontender, nondistended. MS: No deformity or atrophy. Skin: Warm and dry, no rash. Neuro:  Strength and sensation are intact. Psych: Normal affect.  Assessment & Plan    CAD / HLD, LDL goal <70 - Stable with no anginal symptoms. No indication for ischemic  evaluation.  GDMT Aspirin, Carvedilol, Atorvastatin, Jardiance. Heart healthy diet and regular cardiovascular exercise encouraged.  03/2022 LDL 94 not at goal <70. He prefers to proceed with lifestyle changes and repeat FLP/CMP in 3-4 months. If LDL not at goal at that time plan to add Zetia. Needs ETT prior to DOT renewal - plan for ETT.   Shared Decision Making/Informed Consent The risks [chest pain, shortness of breath, cardiac arrhythmias, dizziness, blood pressure fluctuations, myocardial infarction, stroke/transient ischemic attack, and life-threatening complications (estimated to be 1 in 10,000)], benefits (risk stratification, diagnosing coronary artery disease, treatment guidance) and alternatives of an exercise tolerance test were discussed in detail with Mr. Senters and he agrees to proceed.  Chronic combined systolic and diastolic heart failure- Recovered LVEF. Euvolemic and well compensated on exam. GDMT Carvedilol, Lasix, Jardiance, Lisinopril.   Hypertension- BP well controlled. Continue current antihypertensive regimen.  Refills provided.   Obesity- Weight loss via diet and exercise encouraged. Discussed the impact being overweight would have on cardiovascular risk. Plans to discuss with PCP about increasing from Ozempic 0.25mg  to 0.5mg . Did not tolerate 1mg  dose due to constipation, nausea.   Prior tobacco use- Congratulated on continued cessation.   DM2- 03/2022 A1c 6.4. Continue to follow with PCP. Appreciate inclusion of GLP1 and SGLT2i.         Disposition: Follow up in 1 year(s) with 04/2022, MD or APP.  Signed, Chilton Si, NP 05/19/2022, 1:21 PM [facility]

## 2022-05-19 NOTE — Patient Instructions (Addendum)
Medication Instructions:  Your Physician recommend you continue on your current medication as directed.    *If you need a refill on your cardiac medications before your next appointment, please call your pharmacy*   Lab Work: Please return for Lab work in 3-4 months for fasting Lipid Panel and CMP. You may come to the...   [facility] (3rd floor) [address], [place], [place] [address]  Open: 8am-Noon and 1pm-4:30pm  Please ring the doorbell on the small table when you exit the elevator and the Lab Tech will come get you  [place] [facility] [address] [place], [place], [place] [address] Open: 8am-1pm, then 2pm-4:30pm   [place]- Please see attached locations sheet stapled to your lab work with address and hours.   If you have labs (blood work) drawn today and your tests are completely normal, you will receive your results only by: [place] (if you have MyChart) OR A paper copy in the mail If you have any lab test that is abnormal or we need to change your treatment, we will call you to review the results.   Testing/Procedures: Your provider has recommended your for an Exercise Tolerance Test. Please hold your carvedilol the morning of the test!    Follow-Up: At [facility], you and your health needs are our priority.  As part of our continuing mission to provide you with exceptional heart care, we have created designated Provider Care Teams.  These Care Teams include your primary Cardiologist (physician) and Advanced Practice Providers (APPs -  Physician Assistants and Nurse Practitioners) who all work together to provide you with the care you need, when you need it.  We recommend signing up for the patient portal called "MyChart".  Sign up information is provided on this After Visit Summary.  MyChart is used to connect with patients for Virtual Visits (Telemedicine).  Patients are able to view lab/test results,  encounter notes, upcoming appointments, etc.  Non-urgent messages can be sent to your provider as well.   To learn more about what you can do with MyChart, go to [email or web address].    Your next appointment:   1 year(s)  Provider:   Skeet Latch, MD    Other Instructions If your LDL (bad cholesterol) is not at goal of less than 70 we will consider adding a medication called Ezetimibe (Zetia).

## 2022-05-19 NOTE — Addendum Note (Signed)
Addended by: Gerald Stabs on: 05/19/2022 03:51 PM   Modules accepted: Orders

## 2022-05-21 ENCOUNTER — Telehealth: Payer: Self-pay

## 2022-05-21 NOTE — Telephone Encounter (Signed)
Attempted to contact the patient. The line was busy. Will try again later. S.Wiliams EMTP/CCT

## 2022-05-26 ENCOUNTER — Encounter (HOSPITAL_COMMUNITY): Payer: BC Managed Care – PPO

## 2022-05-27 ENCOUNTER — Telehealth (HOSPITAL_COMMUNITY): Payer: Self-pay | Admitting: *Deleted

## 2022-05-27 NOTE — Telephone Encounter (Signed)
Pt reached and given instructions for ETT scheduled on 05/28/22. Pt told to hold metoprolol

## 2022-05-28 ENCOUNTER — Ambulatory Visit (HOSPITAL_COMMUNITY): Payer: BC Managed Care – PPO | Attending: Family

## 2022-05-28 ENCOUNTER — Encounter (HOSPITAL_BASED_OUTPATIENT_CLINIC_OR_DEPARTMENT_OTHER): Payer: Self-pay

## 2022-05-28 ENCOUNTER — Telehealth (HOSPITAL_BASED_OUTPATIENT_CLINIC_OR_DEPARTMENT_OTHER): Payer: Self-pay

## 2022-05-28 DIAGNOSIS — I10 Essential (primary) hypertension: Secondary | ICD-10-CM | POA: Insufficient documentation

## 2022-05-28 DIAGNOSIS — Z6832 Body mass index (BMI) 32.0-32.9, adult: Secondary | ICD-10-CM | POA: Diagnosis not present

## 2022-05-28 DIAGNOSIS — I5022 Chronic systolic (congestive) heart failure: Secondary | ICD-10-CM | POA: Insufficient documentation

## 2022-05-28 DIAGNOSIS — I25118 Atherosclerotic heart disease of native coronary artery with other forms of angina pectoris: Secondary | ICD-10-CM | POA: Diagnosis not present

## 2022-05-28 DIAGNOSIS — E785 Hyperlipidemia, unspecified: Secondary | ICD-10-CM | POA: Insufficient documentation

## 2022-05-28 LAB — EXERCISE TOLERANCE TEST
Angina Index: 0
Duke Treadmill Score: 8
Estimated workload: 9.9
Exercise duration (min): 7 min
Exercise duration (sec): 53 s
MPHR: 177 {beats}/min
Peak HR: 153 {beats}/min
Percent HR: 86 %
Rest HR: 83 {beats}/min
ST Depression (mm): 0 mm

## 2022-05-28 NOTE — Telephone Encounter (Addendum)
Called patient and informed him of results, patient would like letter mailed to him! Letter mailed  ----- Message from [name], NP sent at 05/28/2022  4:52 PM EST ----- Low risk stress test. Good result! Appropriate for DOT renewal. Please provide patient letter.   To whom it may concern, Mr. Kirk Mckee completed ETT 05/28/22 which revealed no ischemia. He is tolerating GDMT for CAD without difficult. His last LVEF was 50%. He is appropriate for CDL.

## 2022-07-15 DIAGNOSIS — I209 Angina pectoris, unspecified: Secondary | ICD-10-CM | POA: Diagnosis not present

## 2022-07-15 DIAGNOSIS — E1151 Type 2 diabetes mellitus with diabetic peripheral angiopathy without gangrene: Secondary | ICD-10-CM | POA: Diagnosis not present

## 2022-07-15 DIAGNOSIS — N182 Chronic kidney disease, stage 2 (mild): Secondary | ICD-10-CM | POA: Diagnosis not present

## 2022-07-15 DIAGNOSIS — I130 Hypertensive heart and chronic kidney disease with heart failure and stage 1 through stage 4 chronic kidney disease, or unspecified chronic kidney disease: Secondary | ICD-10-CM | POA: Diagnosis not present

## 2022-10-12 DIAGNOSIS — E1151 Type 2 diabetes mellitus with diabetic peripheral angiopathy without gangrene: Secondary | ICD-10-CM | POA: Diagnosis not present

## 2022-10-12 DIAGNOSIS — I130 Hypertensive heart and chronic kidney disease with heart failure and stage 1 through stage 4 chronic kidney disease, or unspecified chronic kidney disease: Secondary | ICD-10-CM | POA: Diagnosis not present

## 2023-01-08 DIAGNOSIS — E1151 Type 2 diabetes mellitus with diabetic peripheral angiopathy without gangrene: Secondary | ICD-10-CM | POA: Diagnosis not present

## 2023-01-08 DIAGNOSIS — I130 Hypertensive heart and chronic kidney disease with heart failure and stage 1 through stage 4 chronic kidney disease, or unspecified chronic kidney disease: Secondary | ICD-10-CM | POA: Diagnosis not present

## 2023-03-28 ENCOUNTER — Other Ambulatory Visit (HOSPITAL_BASED_OUTPATIENT_CLINIC_OR_DEPARTMENT_OTHER): Payer: Self-pay | Admitting: Family

## 2023-03-28 DIAGNOSIS — I10 Essential (primary) hypertension: Secondary | ICD-10-CM

## 2023-03-28 DIAGNOSIS — I5022 Chronic systolic (congestive) heart failure: Secondary | ICD-10-CM

## 2023-04-02 DIAGNOSIS — E1151 Type 2 diabetes mellitus with diabetic peripheral angiopathy without gangrene: Secondary | ICD-10-CM | POA: Diagnosis not present

## 2023-04-02 DIAGNOSIS — I130 Hypertensive heart and chronic kidney disease with heart failure and stage 1 through stage 4 chronic kidney disease, or unspecified chronic kidney disease: Secondary | ICD-10-CM | POA: Diagnosis not present

## 2023-04-02 DIAGNOSIS — M109 Gout, unspecified: Secondary | ICD-10-CM | POA: Diagnosis not present

## 2023-04-02 DIAGNOSIS — Z1212 Encounter for screening for malignant neoplasm of rectum: Secondary | ICD-10-CM | POA: Diagnosis not present

## 2023-04-02 DIAGNOSIS — Z125 Encounter for screening for malignant neoplasm of prostate: Secondary | ICD-10-CM | POA: Diagnosis not present

## 2023-04-09 DIAGNOSIS — E1151 Type 2 diabetes mellitus with diabetic peripheral angiopathy without gangrene: Secondary | ICD-10-CM | POA: Diagnosis not present

## 2023-04-09 DIAGNOSIS — Z0000 Encounter for general adult medical examination without abnormal findings: Secondary | ICD-10-CM | POA: Diagnosis not present

## 2023-04-09 DIAGNOSIS — R82998 Other abnormal findings in urine: Secondary | ICD-10-CM | POA: Diagnosis not present

## 2023-04-09 DIAGNOSIS — Z1339 Encounter for screening examination for other mental health and behavioral disorders: Secondary | ICD-10-CM | POA: Diagnosis not present

## 2023-04-09 DIAGNOSIS — Z1331 Encounter for screening for depression: Secondary | ICD-10-CM | POA: Diagnosis not present

## 2023-05-16 ENCOUNTER — Other Ambulatory Visit (HOSPITAL_BASED_OUTPATIENT_CLINIC_OR_DEPARTMENT_OTHER): Payer: Self-pay | Admitting: Family

## 2023-05-16 DIAGNOSIS — I5022 Chronic systolic (congestive) heart failure: Secondary | ICD-10-CM

## 2023-05-16 DIAGNOSIS — I10 Essential (primary) hypertension: Secondary | ICD-10-CM

## 2023-05-27 ENCOUNTER — Telehealth: Payer: Self-pay | Admitting: Family

## 2023-05-27 NOTE — Telephone Encounter (Signed)
Pt called in asking for most recent stress test to be faxed over to number below for DOT physical   Fax: [phone]

## 2023-05-27 NOTE — Telephone Encounter (Signed)
Faxed as requested, confirmation received

## 2023-05-28 NOTE — Telephone Encounter (Signed)
Patient ask that the info be refax because the office is saying they havent received it. Please advised

## 2023-05-28 NOTE — Telephone Encounter (Signed)
Stress test re-faxed to the number provided

## 2023-07-05 DIAGNOSIS — E1151 Type 2 diabetes mellitus with diabetic peripheral angiopathy without gangrene: Secondary | ICD-10-CM | POA: Diagnosis not present

## 2023-07-05 DIAGNOSIS — I130 Hypertensive heart and chronic kidney disease with heart failure and stage 1 through stage 4 chronic kidney disease, or unspecified chronic kidney disease: Secondary | ICD-10-CM | POA: Diagnosis not present

## 2023-10-11 DIAGNOSIS — E1151 Type 2 diabetes mellitus with diabetic peripheral angiopathy without gangrene: Secondary | ICD-10-CM | POA: Diagnosis not present

## 2023-10-11 DIAGNOSIS — I130 Hypertensive heart and chronic kidney disease with heart failure and stage 1 through stage 4 chronic kidney disease, or unspecified chronic kidney disease: Secondary | ICD-10-CM | POA: Diagnosis not present

## 2024-03-27 ENCOUNTER — Other Ambulatory Visit (HOSPITAL_BASED_OUTPATIENT_CLINIC_OR_DEPARTMENT_OTHER): Payer: Self-pay | Admitting: Cardiovascular Disease

## 2024-03-27 DIAGNOSIS — I10 Essential (primary) hypertension: Secondary | ICD-10-CM

## 2024-03-27 DIAGNOSIS — I5022 Chronic systolic (congestive) heart failure: Secondary | ICD-10-CM

## 2024-04-27 ENCOUNTER — Other Ambulatory Visit (HOSPITAL_BASED_OUTPATIENT_CLINIC_OR_DEPARTMENT_OTHER): Payer: Self-pay | Admitting: Cardiovascular Disease

## 2024-04-27 DIAGNOSIS — I5022 Chronic systolic (congestive) heart failure: Secondary | ICD-10-CM

## 2024-04-27 DIAGNOSIS — I10 Essential (primary) hypertension: Secondary | ICD-10-CM

## 2024-05-11 ENCOUNTER — Other Ambulatory Visit (HOSPITAL_BASED_OUTPATIENT_CLINIC_OR_DEPARTMENT_OTHER): Payer: Self-pay | Admitting: Cardiovascular Disease

## 2024-05-11 DIAGNOSIS — I10 Essential (primary) hypertension: Secondary | ICD-10-CM

## 2024-05-11 DIAGNOSIS — I5022 Chronic systolic (congestive) heart failure: Secondary | ICD-10-CM

## 2024-05-11 DIAGNOSIS — E785 Hyperlipidemia, unspecified: Secondary | ICD-10-CM

## 2024-05-11 DIAGNOSIS — Z5181 Encounter for therapeutic drug level monitoring: Secondary | ICD-10-CM

## 2024-05-15 NOTE — Telephone Encounter (Signed)
 In accordance with refill protocols, please review and address the following requirements before this medication refill can be authorized:  Labs

## 2024-05-20 ENCOUNTER — Other Ambulatory Visit (HOSPITAL_BASED_OUTPATIENT_CLINIC_OR_DEPARTMENT_OTHER): Payer: Self-pay | Admitting: *Deleted

## 2024-05-20 DIAGNOSIS — E785 Hyperlipidemia, unspecified: Secondary | ICD-10-CM

## 2024-05-20 DIAGNOSIS — Z5181 Encounter for therapeutic drug level monitoring: Secondary | ICD-10-CM

## 2024-05-20 DIAGNOSIS — I10 Essential (primary) hypertension: Secondary | ICD-10-CM

## 2024-05-20 NOTE — Addendum Note (Signed)
 Addended by: FREDIRICK BEAU B on: 05/20/2024 08:31 AM   Modules accepted: Orders

## 2024-05-20 NOTE — Telephone Encounter (Signed)
 Labs ordered and mychart message to patient

## 2024-06-21 ENCOUNTER — Ambulatory Visit: Admitting: Cardiology

## 2024-08-08 ENCOUNTER — Ambulatory Visit: Admitting: Family Medicine
